# Patient Record
Sex: Female | Born: 1970
Health system: Southern US, Community
[De-identification: ages and names within clinical notes are randomized; demographics above are authoritative.]

## PROBLEM LIST (undated history)

## (undated) DIAGNOSIS — F329 Major depressive disorder, single episode, unspecified: Secondary | ICD-10-CM

## (undated) DIAGNOSIS — F419 Anxiety disorder, unspecified: Secondary | ICD-10-CM

## (undated) DIAGNOSIS — F32A Depression, unspecified: Secondary | ICD-10-CM

## (undated) HISTORY — DX: Depression, unspecified: F32.A

## (undated) HISTORY — DX: Anxiety disorder, unspecified: F41.9

## (undated) HISTORY — PX: INTRAUTERINE DEVICE INSERTION: SHX323

## (undated) HISTORY — PX: WISDOM TOOTH EXTRACTION: SHX21

## (undated) HISTORY — DX: Major depressive disorder, single episode, unspecified: F32.9

---

## 2015-04-13 ENCOUNTER — Encounter (HOSPITAL_BASED_OUTPATIENT_CLINIC_OR_DEPARTMENT_OTHER): Payer: Self-pay | Admitting: *Deleted

## 2015-04-13 ENCOUNTER — Emergency Department (HOSPITAL_BASED_OUTPATIENT_CLINIC_OR_DEPARTMENT_OTHER)
Admission: EM | Admit: 2015-04-13 | Discharge: 2015-04-13 | Disposition: A | Discharge status: Home / Self Care | Payer: Medicaid Other | Attending: Physician Assistant | Admitting: Physician Assistant

## 2015-04-13 DIAGNOSIS — Z87891 Personal history of nicotine dependence: Secondary | ICD-10-CM | POA: Diagnosis not present

## 2015-04-13 DIAGNOSIS — J029 Acute pharyngitis, unspecified: Secondary | ICD-10-CM | POA: Insufficient documentation

## 2015-04-13 LAB — RAPID STREP SCREEN (MED CTR MEBANE ONLY): STREPTOCOCCUS, GROUP A SCREEN (DIRECT): NEGATIVE

## 2015-04-13 LAB — URINALYSIS, ROUTINE W REFLEX MICROSCOPIC
Bilirubin Urine: NEGATIVE
GLUCOSE, UA: NEGATIVE mg/dL
Hgb urine dipstick: NEGATIVE
Ketones, ur: NEGATIVE mg/dL
Leukocytes, UA: NEGATIVE
Nitrite: NEGATIVE
Protein, ur: NEGATIVE mg/dL
SPECIFIC GRAVITY, URINE: 1.004 — AB (ref 1.005–1.030)
Urobilinogen, UA: 0.2 mg/dL (ref 0.0–1.0)
pH: 6.5 (ref 5.0–8.0)

## 2015-04-13 MED ORDER — ACETAMINOPHEN-CODEINE #3 300-30 MG PO TABS: 1.0000 | ORAL_TABLET | Freq: Three times a day (TID) | ORAL | Status: DC | PRN

## 2015-04-13 MED ORDER — AMOXICILLIN 500 MG PO CAPS: 500.0000 mg | ORAL_CAPSULE | Freq: Three times a day (TID) | ORAL | Status: DC

## 2015-04-13 NOTE — ED Notes (Signed)
Pt presents with sore throat this am, states has hx of having strep throat

## 2015-04-13 NOTE — ED Notes (Signed)
MD at bedside. 

## 2015-04-13 NOTE — ED Provider Notes (Signed)
CSN: [id number]     Arrival date & time 04/13/15  1751 History   First MD Initiated Contact with Patient 04/13/15 [phone]     Chief Complaint  Patient presents with  . Sore Throat     (Consider location/radiation/quality/duration/timing/severity/associated sxs/prior Treatment) HPI Patient is a 44 year old female presenting with recurrent sore throat. Patient's had this multiple times the last couple months. She's had 2 course of antibiotics for this. Patient says she gets strep throat a lot recently. Patient has no nausea no fever. Eating drinking normally. The sore throat started this morning.  Patient has had multiple new sexual partners.   History reviewed. No pertinent past medical history. History reviewed. No pertinent past surgical history. History reviewed. No pertinent family history. Social History  Substance Use Topics  . Smoking status: Former Research scientist (life sciences)  . Smokeless tobacco: None  . Alcohol Use: None     Comment: sociably   OB History    No data available     Review of Systems  Constitutional: Negative for activity change.  HENT: Positive for sore throat. Negative for drooling, hearing loss and sinus pressure.   Respiratory: Negative for shortness of breath.   Cardiovascular: Negative for chest pain.  Gastrointestinal: Negative for abdominal pain.      Allergies  Tetracyclines & related  Home Medications   Prior to Admission medications   Not on File   BP 130/87 mmHg  Pulse 73  Temp(Src) 98.5 F (36.9 C) (Oral)  Resp 16  Ht 5\' 5"  (1.651 m)  Wt 145 lb (65.772 kg)  BMI 24.13 kg/m2  SpO2 100%  LMP  Physical Exam  Constitutional: She is oriented to person, place, and time. She appears well-developed and well-nourished.  HENT:  Head: Normocephalic and atraumatic.  Mouth/Throat: Oropharyngeal exudate present.  Patient has mild erythema in the posterior pharynx. She has white exudate on bilateral tonsils. Mild lymphadenopathy.  Eyes: Right eye exhibits  no discharge.  Cardiovascular: Normal rate, regular rhythm and normal heart sounds.   No murmur heard. Pulmonary/Chest: Effort normal and breath sounds normal. She has no wheezes. She has no rales.  Abdominal: Soft. She exhibits no distension. There is no tenderness.  Neurological: She is oriented to person, place, and time.  Skin: Skin is warm and dry. She is not diaphoretic.  Psychiatric: She has a normal mood and affect.  Nursing note and vitals reviewed.   ED Course  Procedures (including critical care time) Labs Review Labs Reviewed  RAPID STREP SCREEN (NOT AT [facility])    Imaging Review No results found. I, [place], personally reviewed and evaluated these images and lab results as part of my medical decision-making.   EKG Interpretation None      MDM   Final diagnoses:  None   patient is a 44 year old female presented today with sore throat. Patient's had sore throat multiple times the last month. While this likely represents strep throat versus viral infection, given with the new sexual partners and recurrent sore throat, concern for chlamydia or gonorrhea. We'll check the lab to this test for this.  We'll send off lab for STDs.. But treat with amoxicillin in the meantime.  Patient's vitals normal physical exam otherwise normal.  Avarie Tavano Julio Alm, MD 04/13/15 [phone]

## 2015-04-13 NOTE — ED Notes (Signed)
States awoke with sore throat

## 2015-04-15 LAB — GC/CHLAMYDIA PROBE AMP (~~LOC~~) NOT AT ARMC
Chlamydia: NEGATIVE
NEISSERIA GONORRHEA: NEGATIVE

## 2015-04-15 LAB — URINE CULTURE: Culture: >=100000

## 2015-04-17 LAB — CULTURE, GROUP A STREP

## 2015-09-23 ENCOUNTER — Emergency Department (HOSPITAL_BASED_OUTPATIENT_CLINIC_OR_DEPARTMENT_OTHER)
Admission: EM | Admit: 2015-09-23 | Discharge: 2015-09-23 | Disposition: A | Discharge status: Home / Self Care | Payer: Medicaid Other | Attending: Emergency Medicine | Admitting: Emergency Medicine

## 2015-09-23 ENCOUNTER — Encounter (HOSPITAL_BASED_OUTPATIENT_CLINIC_OR_DEPARTMENT_OTHER): Payer: Self-pay | Admitting: Emergency Medicine

## 2015-09-23 DIAGNOSIS — Z87891 Personal history of nicotine dependence: Secondary | ICD-10-CM | POA: Insufficient documentation

## 2015-09-23 DIAGNOSIS — Z792 Long term (current) use of antibiotics: Secondary | ICD-10-CM | POA: Diagnosis not present

## 2015-09-23 DIAGNOSIS — M546 Pain in thoracic spine: Secondary | ICD-10-CM | POA: Insufficient documentation

## 2015-09-23 MED ORDER — METHOCARBAMOL 500 MG PO TABS: 500.0000 mg | ORAL_TABLET | Freq: Two times a day (BID) | ORAL | Status: DC

## 2015-09-23 MED ORDER — OXYCODONE-ACETAMINOPHEN 5-325 MG PO TABS
1.0000 | ORAL_TABLET | Freq: Once | ORAL | Status: AC
  Administered 2015-09-23: 1 via ORAL
  Filled 2015-09-23: qty 1

## 2015-09-23 MED ORDER — DIAZEPAM 5 MG PO TABS
5.0000 mg | ORAL_TABLET | Freq: Once | ORAL | Status: AC
  Administered 2015-09-23: 5 mg via ORAL
  Filled 2015-09-23: qty 1

## 2015-09-23 MED ORDER — HYDROCODONE-ACETAMINOPHEN 5-325 MG PO TABS: 1.0000 | ORAL_TABLET | ORAL | Status: DC | PRN

## 2015-09-23 MED ORDER — IBUPROFEN 800 MG PO TABS: 800.0000 mg | ORAL_TABLET | Freq: Three times a day (TID) | ORAL | Status: DC

## 2015-09-23 MED ORDER — KETOROLAC TROMETHAMINE 60 MG/2ML IM SOLN
60.0000 mg | Freq: Once | INTRAMUSCULAR | Status: AC
  Administered 2015-09-23: 60 mg via INTRAMUSCULAR
  Filled 2015-09-23: qty 2

## 2015-09-23 MED FILL — IBUPROFEN 800 MG TABLET: 800 | 7 days supply | Qty: 21 | Fill #0

## 2015-09-23 MED FILL — METHOCARBAMOL 500 MG TABLET: 500 | 10 days supply | Qty: 20 | Fill #0

## 2015-09-23 MED FILL — HYDROCODON-APAP 5-325: 5-325 | 2 days supply | Qty: 6 | Fill #0

## 2015-09-23 NOTE — Discharge Instructions (Signed)
Please take your medications as prescribed and as we discussed. Follow up with your doctor as needed for reevaluation. Return to ED for any new or worsening symptoms as we discussed.  Back Pain, Adult Back pain is very common in adults.The cause of back pain is rarely dangerous and the pain often gets better over time.The cause of your back pain may not be known. Some common causes of back pain include:  Strain of the muscles or ligaments supporting the spine.  Wear and tear (degeneration) of the spinal disks.  Arthritis.  Direct injury to the back. For many people, back pain may return. Since back pain is rarely dangerous, most people can learn to manage this condition on their own. HOME CARE INSTRUCTIONS Watch your back pain for any changes. The following actions may help to lessen any discomfort you are feeling:  Remain active. It is stressful on your back to sit or stand in one place for long periods of time. Do not sit, drive, or stand in one place for more than 30 minutes at a time. Take short walks on even surfaces as soon as you are able.Try to increase the length of time you walk each day.  Exercise regularly as directed by your health care provider. Exercise helps your back heal faster. It also helps avoid future injury by keeping your muscles strong and flexible.  Do not stay in bed.Resting more than 1-2 days can delay your recovery.  Pay attention to your body when you bend and lift. The most comfortable positions are those that put less stress on your recovering back. Always use proper lifting techniques, including:  Bending your knees.  Keeping the load close to your body.  Avoiding twisting.  Find a comfortable position to sleep. Use a firm mattress and lie on your side with your knees slightly bent. If you lie on your back, put a pillow under your knees.  Avoid feeling anxious or stressed.Stress increases muscle tension and can worsen back pain.It is important to  recognize when you are anxious or stressed and learn ways to manage it, such as with exercise.  Take medicines only as directed by your health care provider. Over-the-counter medicines to reduce pain and inflammation are often the most helpful.Your health care provider may prescribe muscle relaxant drugs.These medicines help dull your pain so you can more quickly return to your normal activities and healthy exercise.  Apply ice to the injured area:  Put ice in a plastic bag.  Place a towel between your skin and the bag.  Leave the ice on for 20 minutes, 2-3 times a day for the first 2-3 days. After that, ice and heat may be alternated to reduce pain and spasms.  Maintain a healthy weight. Excess weight puts extra stress on your back and makes it difficult to maintain good posture. SEEK MEDICAL CARE IF:  You have pain that is not relieved with rest or medicine.  You have increasing pain going down into the legs or buttocks.  You have pain that does not improve in one week.  You have night pain.  You lose weight.  You have a fever or chills. SEEK IMMEDIATE MEDICAL CARE IF:   You develop new bowel or bladder control problems.  You have unusual weakness or numbness in your arms or legs.  You develop nausea or vomiting.  You develop abdominal pain.  You feel faint.   This information is not intended to replace advice given to you by your health  care provider. Make sure you discuss any questions you have with your health care provider.   Document Released: 08/17/2005 Document Revised: 09/07/2014 Document Reviewed: 12/19/2013 Elsevier Interactive Patient Education [employer].

## 2015-09-23 NOTE — ED Provider Notes (Signed)
CSN: [id number]     Arrival date & time 09/23/15  0915 History   First MD Initiated Contact with Patient 09/23/15 [phone]     Chief Complaint  Patient presents with  . Back Pain     (Consider location/radiation/quality/duration/timing/severity/associated sxs/prior Treatment) HPI Cathy Schroeder is a 45 y.o. female who comes in for evaluation of back pain. 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 She reports the pain was worse this morning. Not relieved with Tylenol. Has been ambulatory. She denies any fevers, chills, numbness or weakness, loss of bowel or bladder function, IV drug use. Pain is worse with movement. Rated as severe. No other modifying factors.  History reviewed. No pertinent past medical history. History reviewed. No pertinent past surgical history. History reviewed. No pertinent family history. Social History  Substance Use Topics  . Smoking status: Former Research scientist (life sciences)  . Smokeless tobacco: None  . Alcohol Use: None     Comment: sociably   OB History    No data available     Review of Systems A 10 point review of systems was completed and was negative except for pertinent positives and negatives as mentioned in the history of present illness     Allergies  Tetracyclines & related  Home Medications   Prior to Admission medications   Medication Sig Start Date End Date Taking? Authorizing Provider  acetaminophen-codeine (TYLENOL #3) 300-30 MG per tablet Take 1-2 tablets by mouth every 8 (eight) hours as needed for moderate pain. 04/13/15   Courteney Lyn Mackuen, MD  amoxicillin (AMOXIL) 500 MG capsule Take 1 capsule (500 mg total) by mouth 3 (three) times daily. 04/13/15   Courteney Lyn Mackuen, MD  HYDROcodone-acetaminophen (NORCO/VICODIN) 5-325 MG tablet Take 1-2 tablets by mouth every 4 (four) hours as needed. 09/23/15   Comer Locket, PA-C  ibuprofen (ADVIL,MOTRIN) 800 MG  tablet Take 1 tablet (800 mg total) by mouth 3 (three) times daily. 09/23/15   Comer Locket, PA-C  methocarbamol (ROBAXIN) 500 MG tablet Take 1 tablet (500 mg total) by mouth 2 (two) times daily. 09/23/15   Comer Locket, PA-C   BP 123/73 mmHg  Pulse 68  Temp(Src) 98.6 F (37 C) (Oral)  Resp 20  Ht 5\' 5"  (1.651 m)  Wt 74.844 kg  BMI 27.46 kg/m2  SpO2 100% Physical Exam  Constitutional:  Awake, alert, nontoxic appearance with baseline speech.  HENT:  Head: Atraumatic.  Eyes: Pupils are equal, round, and reactive to light. Right eye exhibits no discharge. Left eye exhibits no discharge.  Neck: Neck supple.  Cardiovascular: Normal rate and regular rhythm.   No murmur heard. Pulmonary/Chest: Effort normal and breath sounds normal. No respiratory distress. She has no wheezes. She has no rales. She exhibits no tenderness.  Abdominal: Soft. Bowel sounds are normal. She exhibits no mass. There is no tenderness. There is no rebound.  Musculoskeletal:       Thoracic back: She exhibits no tenderness.       Lumbar back: She exhibits no tenderness.  Tenderness diffusely throughout right paraspinal thoracic musculature. No midline bony tenderness. No rash  Neurological:  Mental status baseline for patient.  Upper extremity motor strength and sensation intact and symmetric bilaterally. Motor strength appears to be intact for bilateral lower extremities, slightly reduced secondary to pain. Gait is somewhat antalgic but no ataxia. Completes fine motor coordination movements without difficulty.  Skin: No rash noted.  Psychiatric:  She has a normal mood and affect.  Nursing note and vitals reviewed.   ED Course  Procedures (including critical care time) Labs Review Labs Reviewed - No data to display  Imaging Review No results found. I have personally reviewed and evaluated these images and lab results as part of my medical decision-making.   EKG Interpretation None     Meds given in  ED:  Medications  oxyCODONE-acetaminophen (PERCOCET/ROXICET) 5-325 MG per tablet 1 tablet (1 tablet Oral Given 09/23/15 1013)  ketorolac (TORADOL) injection 60 mg (60 mg Intramuscular Given 09/23/15 1114)  diazepam (VALIUM) tablet 5 mg (5 mg Oral Given 09/23/15 1113)    New Prescriptions   HYDROCODONE-ACETAMINOPHEN (NORCO/VICODIN) 5-325 MG TABLET    Take 1-2 tablets by mouth every 4 (four) hours as needed.   IBUPROFEN (ADVIL,MOTRIN) 800 MG TABLET    Take 1 tablet (800 mg total) by mouth 3 (three) times daily.   METHOCARBAMOL (ROBAXIN) 500 MG TABLET    Take 1 tablet (500 mg total) by mouth 2 (two) times daily.   Filed Vitals:   09/23/15 0928 09/23/15 1153  BP: 150/96 123/73  Pulse: 86 68  Temp: 98.6 F (37 C)   TempSrc: Oral   Resp: 20 20  Height: 5\' 5"  (1.651 m)   Weight: 74.844 kg   SpO2: 100% 100%     MDM  Patient with back pain after mechanical injury.  No neurological deficits and normal neuro exam.  Patient can walk but states is painful.  No loss of bowel or bladder control.  No concern for cauda equina.  No fever, night sweats, weight loss, h/o cancer, IVDU.  RICE protocol and pain medicine indicated and discussed with patient. Pain managed in emergency department. Discharged with short course pain medicines and muscle relaxers-medication precautions discussed and patient verbalizes understanding. Patient overall appears well, nontoxic, hemodynamically stable and appropriate for discharge.  Final diagnoses:  Acute thoracic back pain       Comer Locket, PA-C 09/23/15 [place], DO 09/23/15 [phone]

## 2015-09-23 NOTE — ED Notes (Signed)
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

## 2015-11-12 ENCOUNTER — Encounter: Payer: Self-pay | Admitting: Obstetrics and Gynecology

## 2015-11-12 ENCOUNTER — Ambulatory Visit (INDEPENDENT_AMBULATORY_CARE_PROVIDER_SITE_OTHER): Payer: BLUE CROSS/BLUE SHIELD | Admitting: Obstetrics and Gynecology

## 2015-11-12 VITALS — BP 122/70 | HR 88 | Resp 16 | Ht 65.5 in | Wt 179.0 lb

## 2015-11-12 DIAGNOSIS — Z113 Encounter for screening for infections with a predominantly sexual mode of transmission: Secondary | ICD-10-CM

## 2015-11-12 DIAGNOSIS — Z01419 Encounter for gynecological examination (general) (routine) without abnormal findings: Secondary | ICD-10-CM

## 2015-11-12 DIAGNOSIS — Z124 Encounter for screening for malignant neoplasm of cervix: Secondary | ICD-10-CM | POA: Diagnosis not present

## 2015-11-12 DIAGNOSIS — F418 Other specified anxiety disorders: Secondary | ICD-10-CM

## 2015-11-12 DIAGNOSIS — Z23 Encounter for immunization: Secondary | ICD-10-CM

## 2015-11-12 DIAGNOSIS — Z30431 Encounter for routine checking of intrauterine contraceptive device: Secondary | ICD-10-CM | POA: Diagnosis not present

## 2015-11-12 MED ORDER — CITALOPRAM HYDROBROMIDE 20 MG PO TABS: ORAL_TABLET | ORAL | Status: DC

## 2015-11-12 NOTE — Patient Instructions (Signed)

## 2015-11-12 NOTE — Progress Notes (Signed)
Patient ID: Cathy Schroeder, female   DOB: 08/31/1971, 46 y.o.   MRN: [id number] 45 y.o. [id number] DivorcedCaucasianF here for annual exam. She has a mirena IUD, due to come out 6 months ago. She would like a new one.  Over the last year she has felt mild depressed, some days worse than others. No thoughts of hurting self or others. The depression is very out of character for her. Mom with h/o depression. She is under a lot of stress.  Sexually active, same partner x 7 months. Not using condoms.  No bleeding with the IUD. No vasomotor symptoms.     No LMP recorded. Patient is not currently having periods (Reason: IUD).          Sexually active: Yes.    The current method of family planning is IUD (expired).    Exercising: Yes.    walking and running  Smoker:  Former smoker   Health Maintenance: Pap:  5 years WNL per patient  History of abnormal Pap:  no MMG:  Never Colonoscopy:  Never BMD:   Never TDaP:  unsure Gardasil: N/A   reports that she has quit smoking. She has never used smokeless tobacco. She reports that she drinks alcohol. She reports that she does not use illicit drugs.Infrequent ETOH use. Going to school for nursing. 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 Kids are 16 ad 11. Both girls. Recently moved here form [place], 1.5 years ago.   Past Medical History  Diagnosis Date  . Anxiety   . Depression     Past Surgical History  Procedure Laterality Date  . Wisdom tooth extraction      No current outpatient prescriptions on file.   No current facility-administered medications for this visit.    Family History  Problem Relation Age of Onset  . Diabetes Mother   . Kidney cancer Mother   . Congestive Heart Failure Mother   Mom died at 59  Review of Systems  Constitutional: Negative.   HENT: Negative.   Eyes: Negative.   Respiratory: Negative.   Cardiovascular: Negative.   Gastrointestinal: Negative.   Endocrine: Negative.   Genitourinary: Negative.    Musculoskeletal: Negative.   Skin: Negative.   Allergic/Immunologic: Negative.   Neurological: Negative.   Psychiatric/Behavioral: Negative.   Occasional GSI, she does reports frequent voiding, normal amounts. She drinks caffeine.   Exam:   BP 122/70 mmHg  Pulse 88  Resp 16  Ht 5' 5.5" (1.664 m)  Wt 179 lb (81.194 kg)  BMI 29.32 kg/m2  Weight change: @WEIGHTCHANGE @ Height:   Height: 5' 5.5" (166.4 cm)  Ht Readings from Last 3 Encounters:  11/12/15 5' 5.5" (1.664 m)  09/23/15 5\' 5"  (1.651 m)  04/13/15 5\' 5"  (1.651 m)    General appearance: alert, cooperative and appears stated age Head: Normocephalic, without obvious abnormality, atraumatic Neck: no adenopathy, supple, symmetrical, trachea midline and thyroid normal to inspection and palpation Lungs: clear to auscultation bilaterally Breasts: normal appearance, no masses or tenderness Heart: regular rate and rhythm Abdomen: soft, non-tender; bowel sounds normal; no masses,  no organomegaly Extremities: extremities normal, atraumatic, no cyanosis or edema Skin: Skin color, texture, turgor normal. No rashes or lesions Lymph nodes: Cervical, supraclavicular, and axillary nodes normal. No abnormal inguinal nodes palpated Neurologic: Grossly normal   Pelvic: External genitalia:  no lesions              Urethra:  normal appearing urethra with no masses, tenderness or lesions  Bartholins and Skenes: normal                 Vagina: normal appearing vagina with normal color and discharge, no lesions              Cervix: no lesions               Bimanual Exam:  Uterus:  normal size, contour, position, consistency, mobility, non-tender and anteverted              Adnexa: no mass, fullness, tenderness               Rectovaginal: Confirms               Anus:  normal sphincter tone, no lesions  Chaperone was present for exam.  A:  Well Woman with normal exam  Depression/anxiety  Contraception  P:   Pap with  hpv  Mammogram  STD testing  CBC, CMP, Lipids, vit D, STD panel (will do all her blood work when she returns for her IUD)  Discussed breast self exam  Discussed calcium and vit D intake  Return for IUD removal and placement of a new IUD  Start Celexa, f/u 1 month   Addendum: will do the iud removal/reinsertion, celexa check and blood work all at her next visit

## 2015-11-12 NOTE — Addendum Note (Signed)
Addended by: Susanne Greenhouse E on: 11/12/2015 03:41 PM   Modules accepted: Orders

## 2015-11-13 ENCOUNTER — Telehealth: Payer: Self-pay | Admitting: Obstetrics and Gynecology

## 2015-11-13 NOTE — Telephone Encounter (Signed)
Called patient to review benefits for a recommended procedure. Unable to leave a message, states "mailbox is full and cannot accept any messages at this time."

## 2015-11-13 NOTE — Telephone Encounter (Signed)
Patient returned call

## 2015-11-14 LAB — IPS N GONORRHOEA AND CHLAMYDIA BY PCR

## 2015-11-14 LAB — IPS PAP TEST WITH HPV

## 2015-12-05 ENCOUNTER — Encounter: Payer: Self-pay | Admitting: Obstetrics and Gynecology

## 2015-12-05 ENCOUNTER — Other Ambulatory Visit: Payer: Self-pay | Admitting: Obstetrics and Gynecology

## 2015-12-05 ENCOUNTER — Ambulatory Visit (INDEPENDENT_AMBULATORY_CARE_PROVIDER_SITE_OTHER): Payer: BLUE CROSS/BLUE SHIELD | Admitting: Obstetrics and Gynecology

## 2015-12-05 VITALS — BP 122/80 | HR 80 | Resp 14 | Wt 177.0 lb

## 2015-12-05 DIAGNOSIS — Z0000 Encounter for general adult medical examination without abnormal findings: Secondary | ICD-10-CM

## 2015-12-05 DIAGNOSIS — Z30433 Encounter for removal and reinsertion of intrauterine contraceptive device: Secondary | ICD-10-CM | POA: Diagnosis not present

## 2015-12-05 DIAGNOSIS — Z01812 Encounter for preprocedural laboratory examination: Secondary | ICD-10-CM | POA: Diagnosis not present

## 2015-12-05 DIAGNOSIS — Z113 Encounter for screening for infections with a predominantly sexual mode of transmission: Secondary | ICD-10-CM | POA: Diagnosis not present

## 2015-12-05 DIAGNOSIS — F329 Major depressive disorder, single episode, unspecified: Secondary | ICD-10-CM | POA: Diagnosis not present

## 2015-12-05 DIAGNOSIS — F32A Depression, unspecified: Secondary | ICD-10-CM

## 2015-12-05 LAB — COMPREHENSIVE METABOLIC PANEL
ALBUMIN: 4.3 g/dL (ref 3.6–5.1)
ALK PHOS: 56 U/L (ref 33–115)
ALT: 16 U/L (ref 6–29)
AST: 15 U/L (ref 10–30)
BUN: 9 mg/dL (ref 7–25)
CALCIUM: 9.3 mg/dL (ref 8.6–10.2)
CHLORIDE: 103 mmol/L (ref 98–110)
CO2: 24 mmol/L (ref 20–31)
Creat: 0.7 mg/dL (ref 0.50–1.10)
Glucose, Bld: 89 mg/dL (ref 65–99)
POTASSIUM: 4.7 mmol/L (ref 3.5–5.3)
Sodium: 138 mmol/L (ref 135–146)
TOTAL PROTEIN: 7.8 g/dL (ref 6.1–8.1)
Total Bilirubin: 1.4 mg/dL — ABNORMAL HIGH (ref 0.2–1.2)

## 2015-12-05 LAB — CBC
HEMATOCRIT: 42 % (ref 35.0–45.0)
HEMOGLOBIN: 14.2 g/dL (ref 11.7–15.5)
MCH: 30.2 pg (ref 27.0–33.0)
MCHC: 33.8 g/dL (ref 32.0–36.0)
MCV: 89.4 fL (ref 80.0–100.0)
MPV: 11.6 fL (ref 7.5–12.5)
Platelets: 223 10*3/uL (ref 140–400)
RBC: 4.7 MIL/uL (ref 3.80–5.10)
RDW: 13 % (ref 11.0–15.0)
WBC: 9.3 10*3/uL (ref 3.8–10.8)

## 2015-12-05 LAB — LIPID PANEL
CHOL/HDL RATIO: 3.6 ratio (ref <=5.0)
CHOLESTEROL: 155 mg/dL (ref 125–200)
HDL: 43 mg/dL — ABNORMAL LOW (ref >=46)
LDL Cholesterol: 93 mg/dL (ref <130)
TRIGLYCERIDES: 97 mg/dL (ref <150)
VLDL: 19 mg/dL (ref <30)

## 2015-12-05 LAB — POCT URINE PREGNANCY: PREG TEST UR: NEGATIVE

## 2015-12-05 LAB — HEPATITIS C ANTIBODY: HCV AB: NEGATIVE

## 2015-12-05 MED ORDER — BUPROPION HCL ER (XL) 150 MG PO TB24: 150.0000 mg | ORAL_TABLET | Freq: Every day | ORAL | Status: DC

## 2015-12-05 MED ORDER — CITALOPRAM HYDROBROMIDE 10 MG PO TABS: 10.0000 mg | ORAL_TABLET | Freq: Every day | ORAL | Status: DC

## 2015-12-05 NOTE — Progress Notes (Signed)
Patient ID: Cathy Schroeder, female   DOB: 19-Jul-1971, 45 y.o.   MRN: [id number] GYNECOLOGY  VISIT   HPI: 45 y.o.   Divorced  Caucasian  female   [phone] with No LMP recorded. Patient is not currently having periods (Reason: IUD).   here for  IUD removal and reinsertion (Mirena). She is also follow up on medication. She was started on Celexa 3 weeks ago for depression with anxiety. It makes her slightly nauseas. She feels it is helping her mood some, less labile, a little more "up". Runs out of stream at the end of the day. Sleeping okay. Libido has decreased with is bothersome.   GYNECOLOGIC HISTORY: No LMP recorded. Patient is not currently having periods (Reason: IUD). Contraception:IUD (Expired) Menopausal hormone therapy: None        OB History    Gravida Para Term Preterm AB TAB SAB Ectopic Multiple Living   3 2 2  1     2          There are no active problems to display for this patient.   Past Medical History  Diagnosis Date  . Anxiety   . Depression     Past Surgical History  Procedure Laterality Date  . Wisdom tooth extraction      Current Outpatient Prescriptions  Medication Sig Dispense Refill  . buPROPion (WELLBUTRIN XL) 150 MG 24 hr tablet Take 1 tablet (150 mg total) by mouth daily. 30 tablet 1  . citalopram (CELEXA) 10 MG tablet Take 1 tablet (10 mg total) by mouth daily. 30 tablet 1   No current facility-administered medications for this visit.     ALLERGIES: Tetracyclines & related  Family History  Problem Relation Age of Onset  . Diabetes Mother   . Kidney cancer Mother   . Congestive Heart Failure Mother     Social History   Social History  . Marital Status: Divorced    Spouse Name: N/A  . Number of Children: N/A  . Years of Education: N/A   Occupational History  . Not on file.   Social History Main Topics  . Smoking status: Former Research scientist (life sciences)  . Smokeless tobacco: Never Used  . Alcohol Use: 0.0 oz/week    0 Standard drinks or equivalent  per week     Comment: sociably  . Drug Use: No  . Sexual Activity:    Partners: Male   Other Topics Concern  . Not on file   Social History Narrative    Review of Systems  Constitutional: Negative.   HENT: Negative.   Eyes: Negative.   Respiratory: Negative.   Cardiovascular: Negative.   Gastrointestinal: Negative.   Genitourinary: Negative.   Musculoskeletal: Negative.   Skin: Negative.   Neurological: Negative.   Endo/Heme/Allergies: Negative.   Psychiatric/Behavioral: Negative.     PHYSICAL EXAMINATION:    BP 122/80 mmHg  Pulse 80  Resp 14  Wt 177 lb (80.287 kg)    General appearance: alert, cooperative and appears stated age  Pelvic: External genitalia:  no lesions              Urethra:  normal appearing urethra with no masses, tenderness or lesions              Bartholins and Skenes: normal                 Vagina: normal appearing vagina with normal color and discharge, no lesions  Cervix: No lesions, IUD string 3 cm. IUD removed with ringed forceps   The risks of the mirena IUD were reviewed with the patient, including infection, abnormal bleeding and uterine perfortion. Consent was signed.  A speculum was placed in the vagina, the cervix was cleansed with betadine. A tenaculum was placed on the cervix, the uterus sounded to 8 cm. The cervix was dilated to a 5 hagar dilator  The mirena IUD was inserted without difficulty. The string were cut to 3-4 cm. The tenaculum was removed. Slight oozing from the tenaculum site was stopped with pressure.   The patient tolerated the procedure well.   Chaperone was present for exam.  ASSESSMENT Depression/Anxiety IUD removal and reinsertion Baseline labs (fasting) and rest of STD testing today    PLAN Will decrease her Celexa to 10 mg a day and add Wellbutrin 150 mg a day, F/u in 1 month Screening labs and blood work for STD testing F/U for an IUD check in 1 month    An After Visit Summary was  printed and given to the patient.  In addition to the IUD minutes face to face time of which over 50% was spent in counseling.

## 2015-12-06 LAB — VITAMIN D 25 HYDROXY (VIT D DEFICIENCY, FRACTURES): Vit D, 25-Hydroxy: 20 ng/mL — ABNORMAL LOW (ref 30–100)

## 2015-12-06 LAB — STD PANEL
HIV: NONREACTIVE
Hepatitis B Surface Ag: NEGATIVE

## 2015-12-09 ENCOUNTER — Telehealth: Payer: Self-pay

## 2015-12-09 LAB — BILIRUBIN, FRACTIONATED(TOT/DIR/INDIR)
BILIRUBIN DIRECT: 0.3 mg/dL — AB (ref <=0.2)
BILIRUBIN INDIRECT: 1.1 mg/dL (ref 0.2–1.2)
BILIRUBIN TOTAL: 1.4 mg/dL — AB (ref 0.2–1.2)

## 2015-12-09 NOTE — Telephone Encounter (Signed)
Spoke with patient. Advised of results as seen below from [place]. She is agreeable and verbalizes understanding. Requesting to pick up a copy of her aex stating that she had her TDap. She will come by the office to pick this up today.  Routing to provider for final review. Patient agreeable to disposition. Will close encounter.

## 2015-12-09 NOTE — Telephone Encounter (Signed)
-----   Message from Salvadore Dom, MD sent at 12/09/2015 10:03 AM EDT ----- Please inform the patient that her direct bilirubin was slightly elevated, I'm trying to add additional labs.  Her Vit D level was low. Please have her start 1,000 IU a day of vit D3, should use daily long term Her HDL was slightly low, the rest of her lipids were normal. A low HDL can increase her risk for heart disease. She should eat a diet low in saturated fats and exercise at least 3 days a week for 30 minutes.  The rest of her blood work was normal

## 2015-12-12 ENCOUNTER — Telehealth: Payer: Self-pay

## 2015-12-12 DIAGNOSIS — R17 Unspecified jaundice: Secondary | ICD-10-CM

## 2015-12-12 NOTE — Telephone Encounter (Signed)
Left message to call [facility] [place] at [phone]. 

## 2015-12-12 NOTE — Telephone Encounter (Signed)
Spoke with patient. Advised of results as seen below from [place]. She denies any RUQ abdominal pain. Aware if she develops any RUQ pain she will need to be seen with her PCP. She is agreeable. Patient is scheduled for a 2 week lab recheck on 12/27/2015 at 4 pm. She is agreeable to date and time. Future orders for total, direct, and indirect bilirubin have been placed.  Routing to provider for final review. Patient agreeable to disposition. Will close encounter.

## 2015-12-12 NOTE — Telephone Encounter (Signed)
-----   Message from Salvadore Dom, MD sent at 12/09/2015  2:34 PM EDT ----- Please inform the patient that her total and direct bilirubin are just minimally elevated. She should have it repeated in 2 weeks, if still elevated, or if she is having RUQ abdominal pain, she will need to be evaluated by a primary MD. Please place the order for total, direct and indirect bilirubin for 2 weeks from now.

## 2015-12-24 ENCOUNTER — Other Ambulatory Visit: Payer: Self-pay | Admitting: Occupational Medicine

## 2015-12-24 ENCOUNTER — Ambulatory Visit (HOSPITAL_COMMUNITY)
Admission: RE | Admit: 2015-12-24 | Discharge: 2015-12-24 | Disposition: A | Discharge status: Home / Self Care | Payer: Medicaid Other | Source: Ambulatory Visit | Attending: Occupational Medicine | Admitting: Occupational Medicine

## 2015-12-24 DIAGNOSIS — A159 Respiratory tuberculosis unspecified: Secondary | ICD-10-CM

## 2015-12-24 DIAGNOSIS — A150 Tuberculosis of lung: Secondary | ICD-10-CM | POA: Insufficient documentation

## 2015-12-24 DIAGNOSIS — J8410 Pulmonary fibrosis, unspecified: Secondary | ICD-10-CM | POA: Diagnosis not present

## 2015-12-27 ENCOUNTER — Telehealth: Payer: Self-pay | Admitting: Obstetrics and Gynecology

## 2015-12-27 ENCOUNTER — Other Ambulatory Visit (INDEPENDENT_AMBULATORY_CARE_PROVIDER_SITE_OTHER): Payer: BLUE CROSS/BLUE SHIELD

## 2015-12-27 ENCOUNTER — Other Ambulatory Visit: Payer: Self-pay | Admitting: *Deleted

## 2015-12-27 DIAGNOSIS — R17 Unspecified jaundice: Secondary | ICD-10-CM

## 2015-12-27 LAB — BILIRUBIN, FRACTIONATED(TOT/DIR/INDIR)
BILIRUBIN DIRECT: 0.2 mg/dL (ref <=0.2)
BILIRUBIN INDIRECT: 0.6 mg/dL (ref 0.2–1.2)
BILIRUBIN TOTAL: 0.8 mg/dL (ref 0.2–1.2)

## 2015-12-27 NOTE — Telephone Encounter (Signed)
Patient came in for a lab appointment today. At checkout she asked if I could send a message to the nurse for assistance with her medications. She said, "I'd like to increase my Wellbutrin to the next level which is 300 mg I think. I am still having some problems with not feeling well in the afternoon so maybe I need it twice a day. I'd like to stop my Celexa. I was prescribed Celexa for anxiety but I'd like something else that I can just take as needed instead of daily. This is nothing urgent so it is fine if the nurse calls me next week."  Pharmacy on file is correct.

## 2015-12-30 NOTE — Telephone Encounter (Signed)
She is due for an IUD check, we can further discuss at that appointment. If she is still on the 10 mg a day of celexa, she can stop it. She would need to see her primary for a medication like ativan or xanax.

## 2015-12-30 NOTE — Telephone Encounter (Signed)
Spoke with patient. Advised of message as seen below from [place]. She is agreeable and verbalizes understanding. Reports she will need to return call to the office to schedule her appointment as she just started a new job.  Routing to provider for final review. Patient agreeable to disposition. Will close encounter.

## 2015-12-30 NOTE — Telephone Encounter (Signed)
Routing to Dr.Jertson for review and advise. 

## 2015-12-30 NOTE — Telephone Encounter (Signed)
Left message to call Kaitlyn at [phone]. 

## 2016-01-09 ENCOUNTER — Ambulatory Visit: Payer: BLUE CROSS/BLUE SHIELD | Admitting: Obstetrics and Gynecology

## 2016-01-20 ENCOUNTER — Telehealth: Payer: Self-pay | Admitting: Obstetrics and Gynecology

## 2016-01-20 MED ORDER — BUPROPION HCL ER (XL) 150 MG PO TB24: 150.0000 mg | ORAL_TABLET | Freq: Every day | ORAL | Status: DC

## 2016-01-20 NOTE — Telephone Encounter (Signed)
Please give her a one month supply

## 2016-01-20 NOTE — Telephone Encounter (Signed)
Order placed for 30 tablets to [facility] per Dr. Talbert Nan. Patient aware will call her if any concerns regarding refill.  Will close encounter.

## 2016-01-20 NOTE — Telephone Encounter (Signed)
Call to patient. She has left her prescription for Wellbutrin 150 XL out of town.  Last fill per [facility] was 12/29/15. Patient would like to refill and not miss any doses before follow up appointment with Dr. Talbert Nan on 01/29/16.  Advised patient will check to see if okay to refill with Dr. Talbert Nan and return calls if any concerns. Patient agreeable.

## 2016-01-20 NOTE — Telephone Encounter (Signed)
Patient went to the beach over the weekend and left her cosmetic bag with her Welbutrin prescription. Patient is not sure what to do now?

## 2016-01-23 ENCOUNTER — Emergency Department (HOSPITAL_BASED_OUTPATIENT_CLINIC_OR_DEPARTMENT_OTHER)
Admission: EM | Admit: 2016-01-23 | Discharge: 2016-01-23 | Disposition: A | Discharge status: Home / Self Care | Payer: Medicaid Other | Attending: Emergency Medicine | Admitting: Emergency Medicine

## 2016-01-23 ENCOUNTER — Encounter (HOSPITAL_BASED_OUTPATIENT_CLINIC_OR_DEPARTMENT_OTHER): Payer: Self-pay

## 2016-01-23 DIAGNOSIS — M545 Low back pain: Secondary | ICD-10-CM | POA: Diagnosis present

## 2016-01-23 DIAGNOSIS — Z87891 Personal history of nicotine dependence: Secondary | ICD-10-CM | POA: Insufficient documentation

## 2016-01-23 DIAGNOSIS — F329 Major depressive disorder, single episode, unspecified: Secondary | ICD-10-CM | POA: Insufficient documentation

## 2016-01-23 DIAGNOSIS — M5489 Other dorsalgia: Secondary | ICD-10-CM

## 2016-01-23 MED ORDER — KETOROLAC TROMETHAMINE 30 MG/ML IJ SOLN
30.0000 mg | Freq: Once | INTRAMUSCULAR | Status: DC
  Filled 2016-01-23: qty 1

## 2016-01-23 MED ORDER — KETOROLAC TROMETHAMINE 30 MG/ML IJ SOLN
30.0000 mg | Freq: Once | INTRAMUSCULAR | Status: AC
  Administered 2016-01-23: 30 mg via INTRAMUSCULAR

## 2016-01-23 MED ORDER — DIAZEPAM 5 MG PO TABS
5.0000 mg | ORAL_TABLET | Freq: Once | ORAL | Status: AC
  Administered 2016-01-23: 5 mg via ORAL
  Filled 2016-01-23: qty 1

## 2016-01-23 MED ORDER — MELOXICAM 7.5 MG PO TABS: 15.0000 mg | ORAL_TABLET | Freq: Every day | ORAL | Status: DC

## 2016-01-23 NOTE — ED Notes (Signed)
C/o lower back pain x this pm-recent hx of lower back pain while lifting a pt-slow steady gait

## 2016-01-23 NOTE — ED Provider Notes (Signed)
CSN: [id number]     Arrival date & time 01/23/16  2141 History   First MD Initiated Contact with Patient 01/23/16 2217     Chief Complaint  Patient presents with  . Back Pain     (Consider location/radiation/quality/duration/timing/severity/associated sxs/prior Treatment) HPI Cathy Schroeder is a 45 y.o. female here for evaluation of back pain that started today. Patient reports lower right-sided back pain that is throbbing and stabbing in nature that radiates into right buttock. She denies any injury or trauma. No numbness, weakness, urinary or bowel changes, fevers or chills, IV drug use. Has not taken anything to improve her symptoms. Certain movements worsen her discomfort. No abdominal pain, urinary symptoms, nausea or vomiting.  Past Medical History  Diagnosis Date  . Anxiety   . Depression    Past Surgical History  Procedure Laterality Date  . Wisdom tooth extraction     Family History  Problem Relation Age of Onset  . Diabetes Mother   . Kidney cancer Mother   . Congestive Heart Failure Mother    Social History  Substance Use Topics  . Smoking status: Former Research scientist (life sciences)  . Smokeless tobacco: Never Used  . Alcohol Use: 0.0 oz/week    0 Standard drinks or equivalent per week     Comment: social   OB History    Gravida Para Term Preterm AB TAB SAB Ectopic Multiple Living   3 2 2  1     2      Review of Systems A 10 point review of systems was completed and was negative except for pertinent positives and negatives as mentioned in the history of present illness     Allergies  Tetracyclines & related  Home Medications   Prior to Admission medications   Medication Sig Start Date End Date Taking? Authorizing Provider  buPROPion (WELLBUTRIN XL) 150 MG 24 hr tablet Take 1 tablet (150 mg total) by mouth daily. 01/20/16   Salvadore Dom, MD  meloxicam (MOBIC) 7.5 MG tablet Take 2 tablets (15 mg total) by mouth daily. 01/23/16   Ruhan Borak, PA-C   BP 150/94 mmHg   Pulse 88  Temp(Src) 98.6 F (37 C) (Oral)  Resp 20  Ht 5\' 5"  (1.651 m)  Wt 77.111 kg  BMI 28.29 kg/m2  SpO2 100% Physical Exam  Constitutional: She appears well-developed and well-nourished. No distress.  HENT:  Head: Normocephalic and atraumatic.  Eyes: Conjunctivae and EOM are normal. Right eye exhibits no discharge. Left eye exhibits no discharge. No scleral icterus.  Neck: Normal range of motion. Neck supple.  Pulmonary/Chest: Effort normal. No respiratory distress.  Abdominal: Soft. She exhibits no distension. There is no tenderness.  Musculoskeletal: Normal range of motion.  Diffuse tenderness in right lumbar paraspinal musculature. No focal midline bony or spinous process tenderness. No crepitus, tenting, rash or other skin abnormality. Full active range of motion of CTL spine. Full active range of motion of all extremities.  Neurological:  Motor strength and sensation appear baseline for patient. Able to stand on tiptoe, dorsiflex great toe. Gait baseline. Negative straight leg raise  Skin: She is not diaphoretic.    ED Course  Procedures (including critical care time) Labs Review Labs Reviewed - No data to display  Imaging Review No results found. I have personally reviewed and evaluated these images and lab results as part of my medical decision-making.   EKG Interpretation None     Meds given in ED:  Medications  diazepam (VALIUM) tablet 5 mg (5  mg Oral Given 01/23/16 2233)  ketorolac (TORADOL) 30 MG/ML injection 30 mg (30 mg Intramuscular Given 01/23/16 2238)    Discharge Medication List as of 01/23/2016 10:59 PM    START taking these medications   Details  meloxicam (MOBIC) 7.5 MG tablet Take 2 tablets (15 mg total) by mouth daily., Starting 01/23/2016, Until Discontinued, Print       Filed Vitals:   01/23/16 2146  BP: 150/94  Pulse: 88  Temp: 98.6 F (37 C)  TempSrc: Oral  Resp: 20  Height: 5\' 5"  (1.651 m)  Weight: 77.111 kg  SpO2: 100%     MDM  Patient with back pain.  No neurological deficits and normal neuro exam.  Patient can walk but states is painful.  No loss of bowel or bladder control.  No concern for cauda equina.  No fever, night sweats, weight loss, h/o cancer, IVDU.  RICE protocol and pain medicine indicated and discussed with patient.   Final diagnoses:  Right-sided back pain, unspecified location        Comer Locket, PA-C 01/23/16 2353  Fredia Sorrow, MD 01/25/16 1731

## 2016-01-23 NOTE — Discharge Instructions (Signed)
Take your medications as prescribed. Follow-up with your doctor as needed. Return to ED for new or worsening symptoms.  Back Pain, Adult Back pain is very common in adults.The cause of back pain is rarely dangerous and the pain often gets better over time.The cause of your back pain may not be known. Some common causes of back pain include:  Strain of the muscles or ligaments supporting the spine.  Wear and tear (degeneration) of the spinal disks.  Arthritis.  Direct injury to the back. For many people, back pain may return. Since back pain is rarely dangerous, most people can learn to manage this condition on their own. HOME CARE INSTRUCTIONS Watch your back pain for any changes. The following actions may help to lessen any discomfort you are feeling:  Remain active. It is stressful on your back to sit or stand in one place for long periods of time. Do not sit, drive, or stand in one place for more than 30 minutes at a time. Take short walks on even surfaces as soon as you are able.Try to increase the length of time you walk each day.  Exercise regularly as directed by your health care provider. Exercise helps your back heal faster. It also helps avoid future injury by keeping your muscles strong and flexible.  Do not stay in bed.Resting more than 1-2 days can delay your recovery.  Pay attention to your body when you bend and lift. The most comfortable positions are those that put less stress on your recovering back. Always use proper lifting techniques, including:  Bending your knees.  Keeping the load close to your body.  Avoiding twisting.  Find a comfortable position to sleep. Use a firm mattress and lie on your side with your knees slightly bent. If you lie on your back, put a pillow under your knees.  Avoid feeling anxious or stressed.Stress increases muscle tension and can worsen back pain.It is important to recognize when you are anxious or stressed and learn ways to  manage it, such as with exercise.  Take medicines only as directed by your health care provider. Over-the-counter medicines to reduce pain and inflammation are often the most helpful.Your health care provider may prescribe muscle relaxant drugs.These medicines help dull your pain so you can more quickly return to your normal activities and healthy exercise.  Apply ice to the injured area:  Put ice in a plastic bag.  Place a towel between your skin and the bag.  Leave the ice on for 20 minutes, 2-3 times a day for the first 2-3 days. After that, ice and heat may be alternated to reduce pain and spasms.  Maintain a healthy weight. Excess weight puts extra stress on your back and makes it difficult to maintain good posture. SEEK MEDICAL CARE IF:  You have pain that is not relieved with rest or medicine.  You have increasing pain going down into the legs or buttocks.  You have pain that does not improve in one week.  You have night pain.  You lose weight.  You have a fever or chills. SEEK IMMEDIATE MEDICAL CARE IF:   You develop new bowel or bladder control problems.  You have unusual weakness or numbness in your arms or legs.  You develop nausea or vomiting.  You develop abdominal pain.  You feel faint.   This information is not intended to replace advice given to you by your health care provider. Make sure you discuss any questions you have with your  health care provider.   Document Released: 08/17/2005 Document Revised: 09/07/2014 Document Reviewed: 12/19/2013 Elsevier Interactive Patient Education [employer].

## 2016-01-29 ENCOUNTER — Ambulatory Visit: Payer: BLUE CROSS/BLUE SHIELD | Admitting: Obstetrics and Gynecology

## 2016-02-03 ENCOUNTER — Ambulatory Visit: Payer: BLUE CROSS/BLUE SHIELD | Admitting: Obstetrics and Gynecology

## 2016-02-07 ENCOUNTER — Encounter: Payer: Self-pay | Admitting: Obstetrics and Gynecology

## 2016-02-07 ENCOUNTER — Ambulatory Visit (INDEPENDENT_AMBULATORY_CARE_PROVIDER_SITE_OTHER): Payer: BLUE CROSS/BLUE SHIELD | Admitting: Obstetrics and Gynecology

## 2016-02-07 ENCOUNTER — Telehealth: Payer: Self-pay | Admitting: Obstetrics and Gynecology

## 2016-02-07 VITALS — BP 112/80 | HR 84 | Resp 14 | Wt 178.0 lb

## 2016-02-07 DIAGNOSIS — R350 Frequency of micturition: Secondary | ICD-10-CM | POA: Diagnosis not present

## 2016-02-07 DIAGNOSIS — F418 Other specified anxiety disorders: Secondary | ICD-10-CM | POA: Diagnosis not present

## 2016-02-07 DIAGNOSIS — R3915 Urgency of urination: Secondary | ICD-10-CM | POA: Diagnosis not present

## 2016-02-07 DIAGNOSIS — B372 Candidiasis of skin and nail: Secondary | ICD-10-CM

## 2016-02-07 DIAGNOSIS — Z30431 Encounter for routine checking of intrauterine contraceptive device: Secondary | ICD-10-CM

## 2016-02-07 LAB — POCT URINALYSIS DIPSTICK
BILIRUBIN UA: NEGATIVE
Blood, UA: NEGATIVE
GLUCOSE UA: NEGATIVE
Ketones, UA: NEGATIVE
Nitrite, UA: NEGATIVE
Protein, UA: NEGATIVE
Urobilinogen, UA: NEGATIVE
pH, UA: 6.5

## 2016-02-07 MED ORDER — NYSTATIN 100000 UNIT/GM EX CREA: 1.0000 "application " | TOPICAL_CREAM | Freq: Two times a day (BID) | CUTANEOUS | Status: DC

## 2016-02-07 MED ORDER — PHENAZOPYRIDINE HCL 200 MG PO TABS: ORAL_TABLET | ORAL | Status: DC

## 2016-02-07 MED ORDER — SULFAMETHOXAZOLE-TRIMETHOPRIM 800-160 MG PO TABS: 1.0000 | ORAL_TABLET | Freq: Two times a day (BID) | ORAL | Status: DC

## 2016-02-07 MED ORDER — BUPROPION HCL ER (XL) 300 MG PO TB24: 300.0000 mg | ORAL_TABLET | Freq: Every day | ORAL | Status: DC

## 2016-02-07 NOTE — Telephone Encounter (Signed)
Patient called and said, "My pharmacy sent me a message that my generic Peridium is not covered by my insurance so I need to know what to do. I am headed to the pharmacy now."  Pharmacy on file is correct.

## 2016-02-07 NOTE — Telephone Encounter (Signed)
Rx for Pyridium 200 mg 1 tablet po tid x 48 hours #6 0RF sent to pharmacy on file by Dr.Jertson this morning for discomfort with urination due to possible UTI. This medication is not covered by the patients insurance.  Routing to [place] for review and advise.

## 2016-02-07 NOTE — Telephone Encounter (Signed)
OK for over the counter AZO standard 100 mg, take 2 tablets by mouth, three times a day for the next 2 days.  This is exactly the same medication!

## 2016-02-07 NOTE — Progress Notes (Signed)
Patient ID: Cathy Schroeder, female   DOB: 07/24/1971, 45 y.o.   MRN: [id number] GYNECOLOGY  VISIT   HPI: 45 y.o.   Divorced  Caucasian  female   [phone] with No LMP recorded. Patient is not currently having periods (Reason: IUD).   here for  Medication and IUD check. She stopped the Celexa 10 mg a day and Wellbutrin 150 mg was started last month. She is feeling much better, still feeling a little blue in the afternoons.  She c/o urinary frequency in the last month. Voiding small amounts, urgency to void. In the last few days she feels slightly uncomfortable when she voids, not burning.  She isn't having any issues with the new mirena IUD, no bleeding. On questioning she does c/o irritation in bilateral inguinal folds. She also c/o a rash on her left upper arm. The rash on her arm appears to be extending.  GYNECOLOGIC HISTORY: No LMP recorded. Patient is not currently having periods (Reason: IUD). Contraception:IUD ([place]) Menopausal hormone therapy: none         OB History    Gravida Para Term Preterm AB TAB SAB Ectopic Multiple Living   3 2 2  1     2          There are no active problems to display for this patient.   Past Medical History  Diagnosis Date  . Anxiety   . Depression     Past Surgical History  Procedure Laterality Date  . Wisdom tooth extraction    . Intrauterine device insertion      Current Outpatient Prescriptions  Medication Sig Dispense Refill  . levonorgestrel (MIRENA) 20 MCG/24HR IUD 1 each by Intrauterine route once.    . meloxicam (MOBIC) 7.5 MG tablet Take 2 tablets (15 mg total) by mouth daily. 30 tablet 0  . buPROPion (WELLBUTRIN XL) 300 MG 24 hr tablet Take 1 tablet (300 mg total) by mouth daily. 90 tablet 2  . nystatin cream (MYCOSTATIN) Apply 1 application topically 2 (two) times daily. Apply to affected area BID for up to 7 days. 30 g 0  . phenazopyridine (PYRIDIUM) 200 MG tablet 1 tab po tid x 48 hours 6 tablet 0  .  sulfamethoxazole-trimethoprim (BACTRIM DS) 800-160 MG tablet Take 1 tablet by mouth 2 (two) times daily. One PO BID x 3 days 6 tablet 0   No current facility-administered medications for this visit.     ALLERGIES: Tetracyclines & related  Family History  Problem Relation Age of Onset  . Diabetes Mother   . Kidney cancer Mother   . Congestive Heart Failure Mother     Social History   Social History  . Marital Status: Divorced    Spouse Name: N/A  . Number of Children: N/A  . Years of Education: N/A   Occupational History  . Not on file.   Social History Main Topics  . Smoking status: Former Research scientist (life sciences)  . Smokeless tobacco: Never Used  . Alcohol Use: 0.0 oz/week    0 Standard drinks or equivalent per week     Comment: social  . Drug Use: No  . Sexual Activity:    Partners: Male    Patent examiner Protection: IUD   Other Topics Concern  . Not on file   Social History Narrative    Review of Systems  Constitutional: Negative.   HENT: Negative.   Eyes: Negative.   Respiratory: Negative.   Cardiovascular: Negative.   Gastrointestinal: Negative.   Genitourinary: Positive for frequency.  Musculoskeletal: Positive for myalgias.  Skin: Negative.   Neurological: Negative.   Endo/Heme/Allergies: Negative.   Psychiatric/Behavioral: Positive for depression.    PHYSICAL EXAMINATION:    BP 112/80 mmHg  Pulse 84  Resp 14  Wt 178 lb (80.74 kg)    General appearance: alert, cooperative and appears stated age Skin: on her upper arm she has a mild erythematous rash with clear borders, it appears to be changing the pigmentation in her skin. She has a separate area of the rash that is in a circle.   Pelvic: External genitalia:  no lesions. She does have a patchy, erythematous rash in her bilateral inguinal folds, c/w candida intertrigo.               Urethra:  normal appearing urethra with no masses, tenderness or lesions              Bartholins and Skenes: normal                  Vagina: normal appearing vagina with normal color and discharge, no lesions              Cervix: no lesions and IUD strings 3-4 cm              Bimanual Exam:  Uterus:  normal size, contour, position, consistency, mobility, non-tender              Adnexa: no mass, fullness, tenderness               Chaperone was present for exam.  ASSESSMENT Depression, improved on the Wellbutrin 150 mg, still a little "blue", she would like to increase the dose Anxiety is stable, currently tolerable IUD check, doing well Urinary frequency and urgency, some discomfort, dip concerning for infection Candida intertrigo Rash on her arm    PLAN Increase Wellbutrin to 300 mg qd Will treat for presumed UTI with bactrim and pyridium Treat candida Recommended she f/u with her primary or dermatology for the rash on her arm F/U as needed or in 3/18 for her next annual exam   An After Visit Summary was printed and given to the patient.

## 2016-02-07 NOTE — Telephone Encounter (Signed)
Spoke with patient. Advised of message as seen below from [place]. She is agreeable and verbalizes understanding.  Routing to provider for final review. Patient agreeable to disposition. Will close encounter.

## 2016-02-08 LAB — URINALYSIS, MICROSCOPIC ONLY
CRYSTALS: NONE SEEN [HPF]
Casts: NONE SEEN [LPF]
Squamous Epithelial / LPF: NONE SEEN [HPF] (ref <=5)
Yeast: NONE SEEN [HPF]

## 2016-02-10 ENCOUNTER — Telehealth: Payer: Self-pay | Admitting: *Deleted

## 2016-02-10 LAB — URINE CULTURE: Colony Count: >=100000

## 2016-02-10 NOTE — Telephone Encounter (Signed)
Patient called back- gave results- see result note -eh

## 2016-02-10 NOTE — Telephone Encounter (Signed)
02-10-16 left message to call -eh

## 2016-02-10 NOTE — Telephone Encounter (Signed)
-----   Message from Salvadore Dom, MD sent at 02/10/2016  9:47 AM EDT ----- Please let the patient know that she does have a UTI, the antibiotic she is on should be appropriate for treating it. Please see if she is feeling better.

## 2016-03-12 ENCOUNTER — Ambulatory Visit (INDEPENDENT_AMBULATORY_CARE_PROVIDER_SITE_OTHER): Payer: BLUE CROSS/BLUE SHIELD | Admitting: Obstetrics and Gynecology

## 2016-03-12 ENCOUNTER — Encounter: Payer: Self-pay | Admitting: Obstetrics and Gynecology

## 2016-03-12 ENCOUNTER — Telehealth: Payer: Self-pay | Admitting: Obstetrics and Gynecology

## 2016-03-12 ENCOUNTER — Telehealth: Payer: Self-pay | Admitting: *Deleted

## 2016-03-12 VITALS — BP 122/60 | HR 76 | Resp 15 | Wt 174.0 lb

## 2016-03-12 DIAGNOSIS — R300 Dysuria: Secondary | ICD-10-CM | POA: Diagnosis not present

## 2016-03-12 DIAGNOSIS — R350 Frequency of micturition: Secondary | ICD-10-CM | POA: Diagnosis not present

## 2016-03-12 DIAGNOSIS — L293 Anogenital pruritus, unspecified: Secondary | ICD-10-CM | POA: Diagnosis not present

## 2016-03-12 DIAGNOSIS — N898 Other specified noninflammatory disorders of vagina: Secondary | ICD-10-CM

## 2016-03-12 LAB — POCT URINALYSIS DIPSTICK
Bilirubin, UA: NEGATIVE
Blood, UA: NEGATIVE
Glucose, UA: NEGATIVE
KETONES UA: NEGATIVE
Leukocytes, UA: NEGATIVE
NITRITE UA: NEGATIVE
PH UA: 6.5
PROTEIN UA: NEGATIVE
Urobilinogen, UA: NEGATIVE

## 2016-03-12 LAB — WET PREP BY MOLECULAR PROBE
CANDIDA SPECIES: NEGATIVE
GARDNERELLA VAGINALIS: NEGATIVE
TRICHOMONAS VAG: NEGATIVE

## 2016-03-12 MED ORDER — BETAMETHASONE VALERATE 0.1 % EX OINT: TOPICAL_OINTMENT | CUTANEOUS | Status: DC

## 2016-03-12 MED ORDER — NITROFURANTOIN MONOHYD MACRO 100 MG PO CAPS: 100.0000 mg | ORAL_CAPSULE | Freq: Two times a day (BID) | ORAL | Status: DC

## 2016-03-12 MED ORDER — PHENAZOPYRIDINE HCL 200 MG PO TABS: ORAL_TABLET | ORAL | Status: DC

## 2016-03-12 NOTE — Telephone Encounter (Signed)
Spoke with Dr. Talbert Nan, patient needs to be seen. I called patient back and made her an appointment for 11:30 today-eh

## 2016-03-12 NOTE — Telephone Encounter (Signed)
Spoke with patient. Patient states that Pyridium is not covered by her insurance. Advised she may use over the counter AZO standard 100 mg, take 2 tablets by mouth, three times a day for 2 days. Advised this is the same as Pyridium. She is agreeable and verbalizes understanding.  Routing to provider for final review. Patient agreeable to disposition. Will close encounter.

## 2016-03-12 NOTE — Progress Notes (Signed)
GYNECOLOGY  VISIT   HPI: 45 y.o.   Divorced  Caucasian  female   [phone] with No LMP recorded. Patient is not currently having periods (Reason: IUD).   here c/o dysuria, urinary frequency and vaginal itching. She is also having RLQ pain. She c/o vaginal irritation and itching is severe, started last night. Also having right flank pain. Over the last week she c/o urinary frequency, voiding small amounts, dysuria (not sure if external). No fevers. No abnormal d/c. She was treated for a UTI last month.      GYNECOLOGIC HISTORY: No LMP recorded. Patient is not currently having periods (Reason: IUD). Contraception:IUD Menopausal hormone therapy: none         OB History    Gravida Para Term Preterm AB TAB SAB Ectopic Multiple Living   3 2 2  1     2          There are no active problems to display for this patient.   Past Medical History  Diagnosis Date  . Anxiety   . Depression     Past Surgical History  Procedure Laterality Date  . Wisdom tooth extraction    . Intrauterine device insertion      Current Outpatient Prescriptions  Medication Sig Dispense Refill  . buPROPion (WELLBUTRIN XL) 300 MG 24 hr tablet Take 1 tablet (300 mg total) by mouth daily. 90 tablet 2  . levonorgestrel (MIRENA) 20 MCG/24HR IUD 1 each by Intrauterine route once.    . betamethasone valerate ointment (VALISONE) 0.1 % Place a pea sized amount topically BID x 1-2 weeks as needed 15 g 0  . nitrofurantoin, macrocrystal-monohydrate, (MACROBID) 100 MG capsule Take 1 capsule (100 mg total) by mouth 2 (two) times daily. 14 capsule 0  . phenazopyridine (PYRIDIUM) 200 MG tablet 1 tab po TID x 2 days 6 tablet 0   No current facility-administered medications for this visit.     ALLERGIES: Tetracyclines & related  Family History  Problem Relation Age of Onset  . Diabetes Mother   . Kidney cancer Mother   . Congestive Heart Failure Mother     Social History   Social History  . Marital Status: Divorced    Spouse Name: N/A  . Number of Children: N/A  . Years of Education: N/A   Occupational History  . Not on file.   Social History Main Topics  . Smoking status: Former Research scientist (life sciences)  . Smokeless tobacco: Never Used  . Alcohol Use: 0.0 oz/week    0 Standard drinks or equivalent per week     Comment: social  . Drug Use: No  . Sexual Activity:    Partners: Male    Patent examiner Protection: IUD   Other Topics Concern  . Not on file   Social History Narrative    Review of Systems  Constitutional: Negative.   HENT: Negative.   Eyes: Negative.   Respiratory: Negative.   Cardiovascular: Negative.   Gastrointestinal: Negative.   Genitourinary: Positive for dysuria, urgency, frequency and flank pain.       RLQ pain  Musculoskeletal: Negative.   Skin: Negative.   Neurological: Negative.   Endo/Heme/Allergies: Negative.   Psychiatric/Behavioral: Negative.     PHYSICAL EXAMINATION:    BP 122/60 mmHg  Pulse 76  Resp 15  Wt 174 lb (78.926 kg)    General appearance: alert, cooperative and appears stated age Abdomen: soft, mildly tender in the suprapubic region, no rebound, no guarding. ; no masses,  no organomegaly  Pelvic: External genitalia:  no lesions              Urethra:  normal appearing urethra with no masses, tenderness or lesions              Bartholins and Skenes: normal                 Vagina: normal appearing vagina with normal color and discharge, no lesions              Cervix: no lesions and IUD string 2-3 cm              Bimanual Exam:  Uterus:  normal size, contour, position, consistency, mobility, non-tender              Adnexa: no mass, fullness, tenderness              Bladder tender to palpation  Chaperone was present for exam.  Urine dip negative  Wet prep: ? clue, no trich, +++ wbc KOH: no yeast PH: 4   ASSESSMENT Urinary frequency, urgency, bladder discomfort, dysuria (?external). Even with negative urine dip, her symptoms are very concerning for a  UTI Genital pruritus, suspect yeast, not seen on vaginal slides Vaginal irritaiton   PLAN Send urine for ua, c&s Wet prep probe Will treat for presumed UTI given severity of symptoms (macrobid and pyridium) Steroids for pruritus, further treatment depending on lab results   An After Visit Summary was printed and given to the patient.

## 2016-03-12 NOTE — Telephone Encounter (Signed)
Patient called and said her UTI symptoms are back. She is requesting ABX called in.

## 2016-03-12 NOTE — Telephone Encounter (Signed)
Patient was just in to see Dr. Talbert Nan and her prescription is not covered by insurance. Would like something else called in if possible.

## 2016-03-13 LAB — URINALYSIS, MICROSCOPIC ONLY
Bacteria, UA: NONE SEEN [HPF]
CASTS: NONE SEEN [LPF]
Crystals: NONE SEEN [HPF]
RBC / HPF: NONE SEEN RBC/HPF (ref <=2)
WBC, UA: NONE SEEN WBC/HPF (ref <=5)
YEAST: NONE SEEN [HPF]

## 2016-03-13 LAB — URINE CULTURE: Colony Count: 2000

## 2016-03-18 ENCOUNTER — Telehealth: Payer: Self-pay | Admitting: Obstetrics and Gynecology

## 2016-03-18 NOTE — Telephone Encounter (Signed)
Entered by Salvadore Dom, MD at 03/15/2016 2:42 PM    Hi Cathy Schroeder,  Your vaginitis probe was negative. The steroid ointment should correct the itching. Let me know if you aren't feeling better next week.  Your urine culture is still pending.  [place] Boast  [facility],  Your urine culture is negative, you do not have a urinary tract infection. You should stop the antibiotics. If you aren't feeling better, please call the office.  Sumner Boast    Attempted to reach patient at number provided, [phone], the recording states that the voicemail box is currently full and is not accepting new messages at this time.

## 2016-03-18 NOTE — Telephone Encounter (Signed)
Patient called and said, "When my results are ready, please just call and leave the result details on my voicemail. I am at work and that is the only way I can get them. I'll call back if I have any questions."

## 2016-03-19 ENCOUNTER — Encounter: Payer: Self-pay | Admitting: Obstetrics and Gynecology

## 2016-03-20 ENCOUNTER — Telehealth: Payer: Self-pay

## 2016-03-20 NOTE — Telephone Encounter (Signed)
Non-Urgent Medical Question  Message [phone]   From  [facility]   To  Salvadore Dom, MD   Sent  03/19/2016 1:01 PM     I received your message saying results are on my chart.. I'm glad to see my tests are negative however I'm still having the urge and pressure to urinate all the time.. my vagina does not hurt like it did before I started this last round of antibiotics. I feel better but it still feels as if something is wrong. I am leaving for the beach for a long weekend. If you feel there could be something else we could do to stop the feeling of having to pee every 30 minutes I would love to hear it   Thank you  [place] No one has taken responsibility for this message.     No actions have been taken on this message.     Patient was seen in office on 03/12/2016. Urine culture was sent for urinary symptoms which returned negative. Patient reports she is still having urinary urgency and pressure. Please review and advise as Dr.Jertson is out of the office today.

## 2016-03-20 NOTE — Telephone Encounter (Signed)
Spoke with patient. Advised of message as seen below from [place]. Patient is agreeable and verbalizes understanding. Appointment scheduled for 03/26/2016 at 9:30 am with Dr.Jertson. Patient is agreeable to date and time. Asking if she may also have her glucose levels checked at her appointment as she is worried about diabetes. Reports she has been fatigued lately as well and is concerned. Advised she may discuss labs with Dr.Jertson at her appointment and recommended lab work can be performed. She is agreeable.  Cc: Dr.Jerton  Routing to covering provider for final review. Patient agreeable to disposition. Will close encounter.

## 2016-03-20 NOTE — Telephone Encounter (Signed)
Spoke with patient. Please see telephone encounter dated with today's date. 

## 2016-03-20 NOTE — Telephone Encounter (Signed)
I would recommend eliminating any bladder irritants - caffeine, carbonation, citrus, spice, alcohol.  This may decrease the urinary frequency.  Please have the patient make a follow up appointment with Dr. Talbert Nan for further evaluation.

## 2016-03-23 NOTE — Telephone Encounter (Signed)
Please see telephone encounter dated 03/20/2016. Patient has seen her results given mychart and has been given further recommendations. She is scheduled to see Dr.Jertson for follow up on 03/26/2016 at 9:30 am.  Routing to provider for final review. Patient agreeable to disposition. Will close encounter.

## 2016-03-25 ENCOUNTER — Telehealth: Payer: Self-pay | Admitting: Obstetrics and Gynecology

## 2016-03-25 NOTE — Telephone Encounter (Signed)
Patient cancelled her appointment for problem visit. Says she is feeling so much better after cutting back the amount of caffeine she was drinking.

## 2016-03-26 ENCOUNTER — Ambulatory Visit: Payer: BLUE CROSS/BLUE SHIELD | Admitting: Obstetrics and Gynecology

## 2016-05-21 DIAGNOSIS — R030 Elevated blood-pressure reading, without diagnosis of hypertension: Secondary | ICD-10-CM | POA: Insufficient documentation

## 2016-05-21 DIAGNOSIS — B354 Tinea corporis: Secondary | ICD-10-CM | POA: Insufficient documentation

## 2016-05-21 DIAGNOSIS — F419 Anxiety disorder, unspecified: Secondary | ICD-10-CM | POA: Insufficient documentation

## 2016-05-21 DIAGNOSIS — R350 Frequency of micturition: Secondary | ICD-10-CM | POA: Insufficient documentation

## 2016-05-21 DIAGNOSIS — E663 Overweight: Secondary | ICD-10-CM | POA: Insufficient documentation

## 2016-05-21 DIAGNOSIS — M5412 Radiculopathy, cervical region: Secondary | ICD-10-CM | POA: Insufficient documentation

## 2016-08-26 ENCOUNTER — Encounter: Payer: Self-pay | Admitting: Obstetrics and Gynecology

## 2016-09-14 ENCOUNTER — Encounter: Payer: Self-pay | Admitting: Obstetrics and Gynecology

## 2016-11-01 IMAGING — CR DG CHEST 2V
2 series · 2 of 2 positions shown · non-contrast
Comparison: None.

CLINICAL DATA: Positive TB test.

EXAM:
CHEST  2 VIEW

[w chest pa]
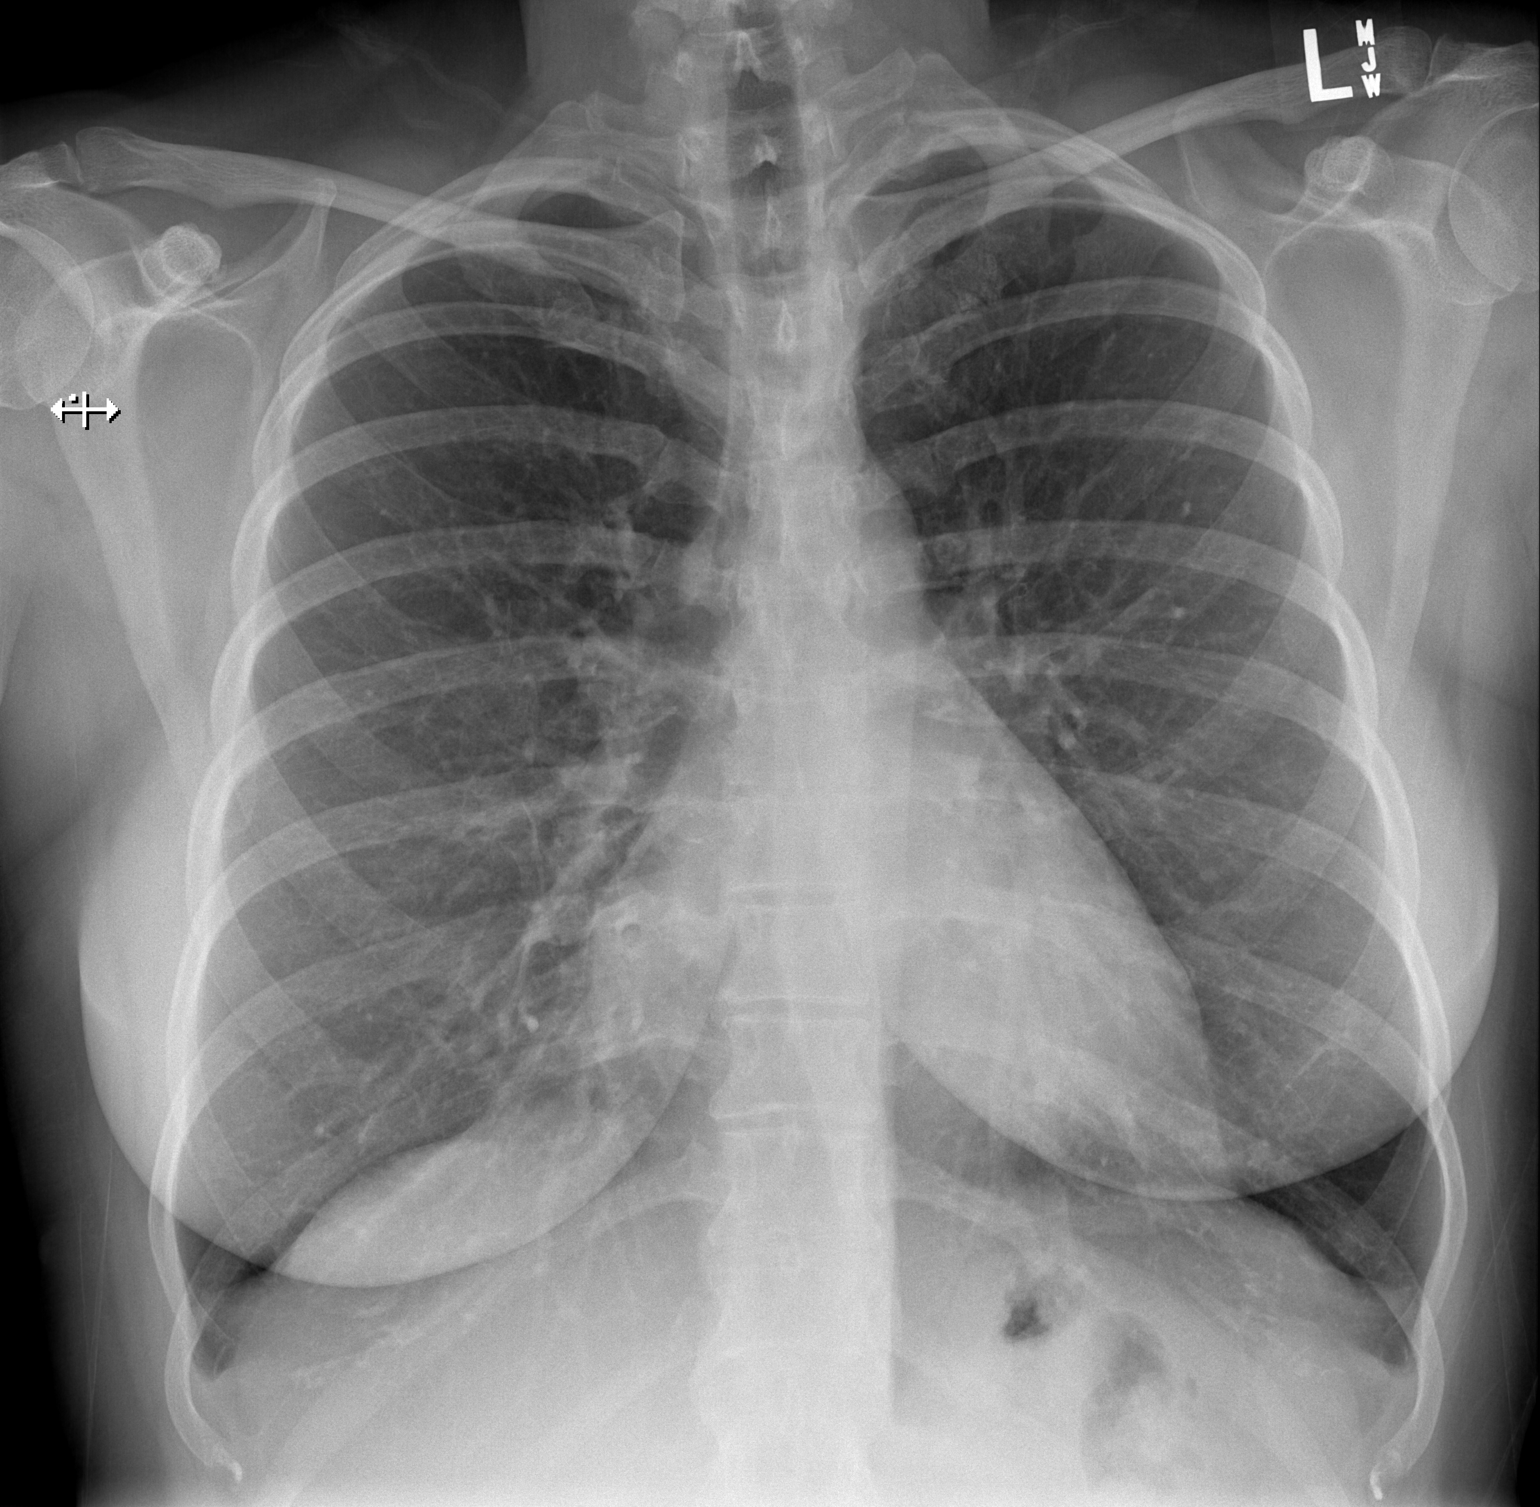

[w chest lat]
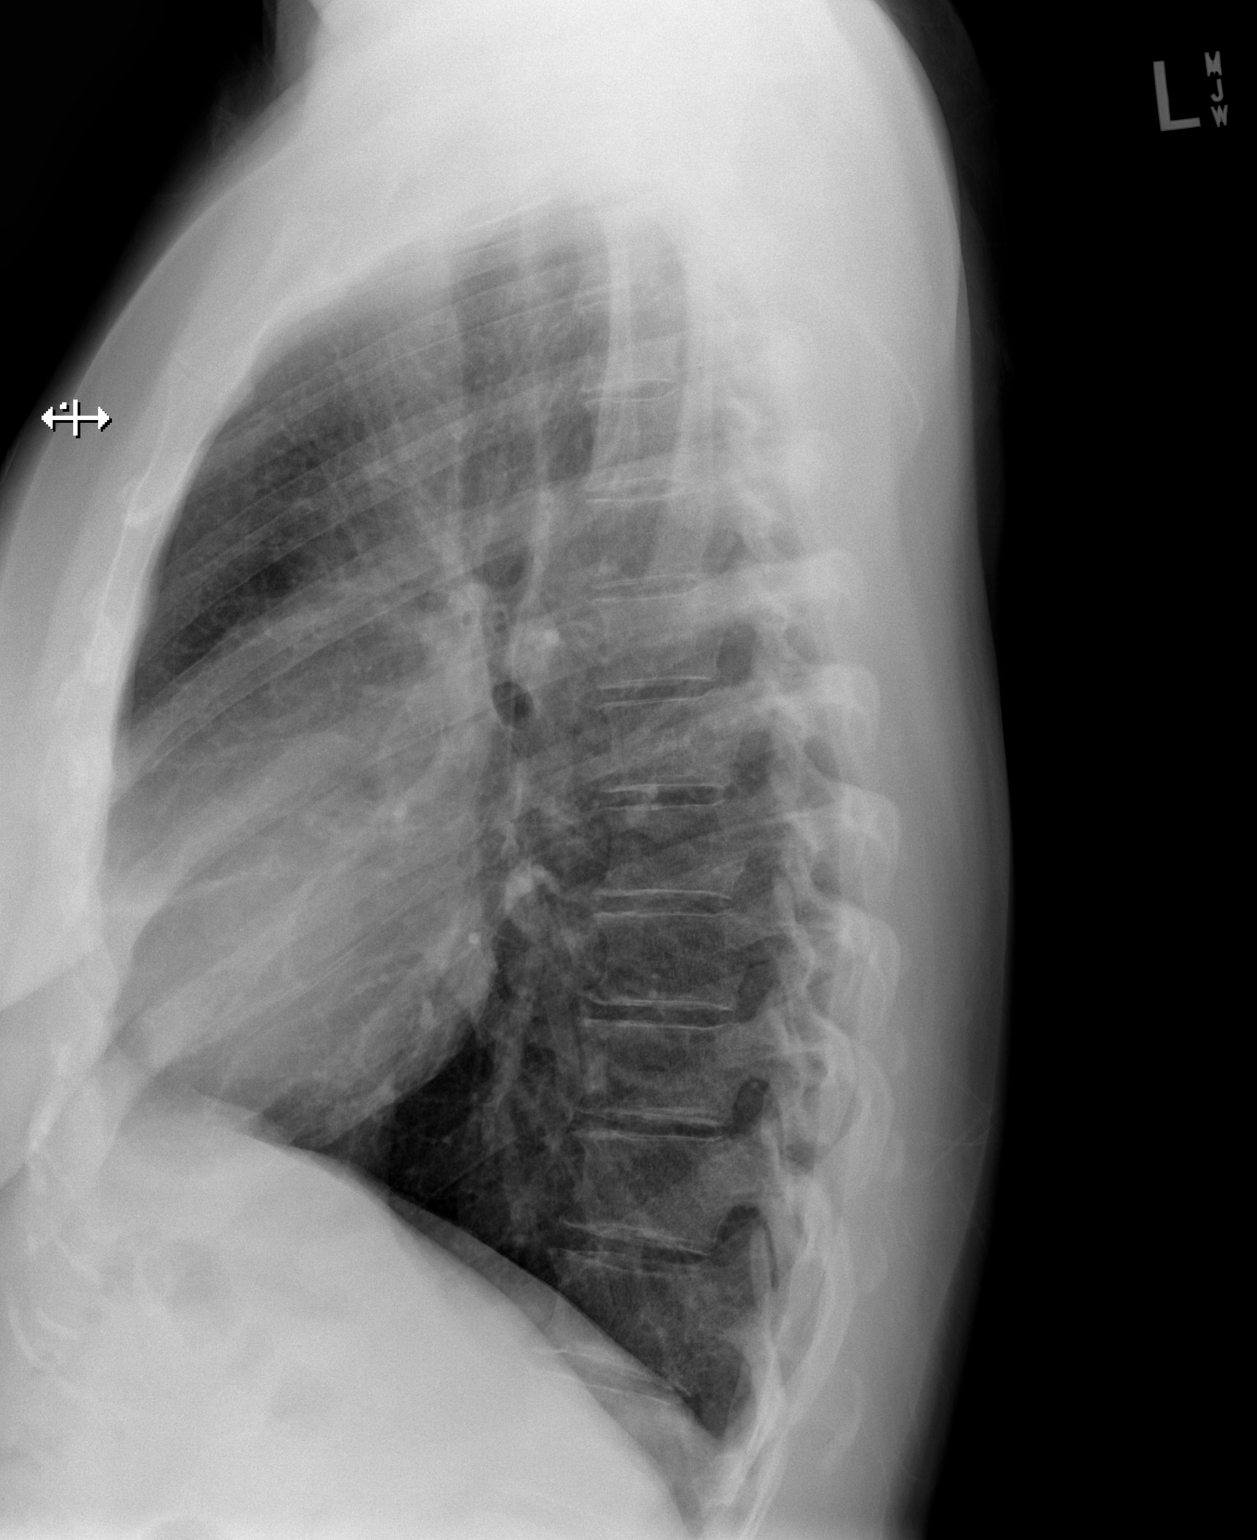

[2 of 2 positions shown; findings below may reference images not displayed]

FINDINGS: The heart size and mediastinal contours are within normal limits.
There are tiny calcified granulomas in the left upper lobe. There is
no focal pneumonia, pulmonary edema, or pleural effusion. The
visualized skeletal structures are unremarkable.
IMPRESSION: No active cardiopulmonary disease. Tiny calcified granulomas in the
left upper lobe.

## 2016-11-07 ENCOUNTER — Other Ambulatory Visit: Payer: Self-pay | Admitting: Obstetrics and Gynecology

## 2016-11-09 NOTE — Telephone Encounter (Signed)
Medication refill request: wellbutrin  Last AEX:  11/12/15 JJ Next AEX: none Last MMG (if hormonal medication request): 08/07/16 BIRADS3:Probably benign  Refill authorized: 02/07/16 #[id number]. Today please advise.

## 2016-11-09 NOTE — Telephone Encounter (Signed)
1 month script sent. Please have her set up her annual

## 2016-11-10 NOTE — Telephone Encounter (Signed)
Voicemail full and cannot leave message. Will try again later.

## 2016-12-07 ENCOUNTER — Other Ambulatory Visit: Payer: Self-pay

## 2016-12-07 MED ORDER — BUPROPION HCL ER (XL) 300 MG PO TB24: 300.0000 mg | ORAL_TABLET | Freq: Every day | ORAL | 0 refills | Status: DC

## 2016-12-07 NOTE — Telephone Encounter (Signed)
Medication refill request: Bupropion Last AEX:  11/12/15 JJ Next AEX: 12/15/16 JJ Last MMG (if hormonal medication request): 08/07/16 BIRADS3, Density C, [facility]. Recommends 6 month f/u.  Refill authorized: 11/09/16 #[id number]. Please advise. Thank you.

## 2016-12-15 ENCOUNTER — Encounter: Payer: Self-pay | Admitting: Obstetrics and Gynecology

## 2016-12-15 ENCOUNTER — Ambulatory Visit (INDEPENDENT_AMBULATORY_CARE_PROVIDER_SITE_OTHER): Payer: BLUE CROSS/BLUE SHIELD | Admitting: Obstetrics and Gynecology

## 2016-12-15 VITALS — BP 130/82 | HR 80 | Resp 16 | Ht 65.5 in | Wt 180.0 lb

## 2016-12-15 DIAGNOSIS — R350 Frequency of micturition: Secondary | ICD-10-CM | POA: Diagnosis not present

## 2016-12-15 DIAGNOSIS — Z0000 Encounter for general adult medical examination without abnormal findings: Secondary | ICD-10-CM

## 2016-12-15 DIAGNOSIS — Z833 Family history of diabetes mellitus: Secondary | ICD-10-CM | POA: Diagnosis not present

## 2016-12-15 DIAGNOSIS — Z30431 Encounter for routine checking of intrauterine contraceptive device: Secondary | ICD-10-CM

## 2016-12-15 DIAGNOSIS — K5900 Constipation, unspecified: Secondary | ICD-10-CM

## 2016-12-15 DIAGNOSIS — E559 Vitamin D deficiency, unspecified: Secondary | ICD-10-CM

## 2016-12-15 DIAGNOSIS — Z01419 Encounter for gynecological examination (general) (routine) without abnormal findings: Secondary | ICD-10-CM

## 2016-12-15 DIAGNOSIS — L293 Anogenital pruritus, unspecified: Secondary | ICD-10-CM | POA: Diagnosis not present

## 2016-12-15 LAB — CBC
HEMATOCRIT: 40.8 % (ref 35.0–45.0)
Hemoglobin: 13.1 g/dL (ref 11.7–15.5)
MCH: 28.8 pg (ref 27.0–33.0)
MCHC: 32.1 g/dL (ref 32.0–36.0)
MCV: 89.7 fL (ref 80.0–100.0)
MPV: 11.3 fL (ref 7.5–12.5)
Platelets: 246 10*3/uL (ref 140–400)
RBC: 4.55 MIL/uL (ref 3.80–5.10)
RDW: 13.3 % (ref 11.0–15.0)
WBC: 7 10*3/uL (ref 3.8–10.8)

## 2016-12-15 LAB — POCT URINALYSIS DIPSTICK
BILIRUBIN UA: NEGATIVE
Blood, UA: NEGATIVE
Glucose, UA: NEGATIVE
Ketones, UA: NEGATIVE
Nitrite, UA: NEGATIVE
Protein, UA: NEGATIVE
UROBILINOGEN UA: NEGATIVE U/dL — AB
pH, UA: 6 (ref 5.0–8.0)

## 2016-12-15 LAB — COMPREHENSIVE METABOLIC PANEL
ALBUMIN: 4.2 g/dL (ref 3.6–5.1)
ALK PHOS: 59 U/L (ref 33–115)
ALT: 12 U/L (ref 6–29)
AST: 13 U/L (ref 10–30)
BUN: 11 mg/dL (ref 7–25)
CO2: 23 mmol/L (ref 20–31)
CREATININE: 0.9 mg/dL (ref 0.50–1.10)
Calcium: 9.4 mg/dL (ref 8.6–10.2)
Chloride: 105 mmol/L (ref 98–110)
Glucose, Bld: 92 mg/dL (ref 65–99)
Potassium: 4.5 mmol/L (ref 3.5–5.3)
Sodium: 139 mmol/L (ref 135–146)
TOTAL PROTEIN: 7.3 g/dL (ref 6.1–8.1)
Total Bilirubin: 0.5 mg/dL (ref 0.2–1.2)

## 2016-12-15 LAB — URINALYSIS, MICROSCOPIC ONLY
CRYSTALS: NONE SEEN [HPF]
Casts: NONE SEEN [LPF]
RBC / HPF: NONE SEEN RBC/HPF (ref <=2)
Yeast: NONE SEEN [HPF]

## 2016-12-15 LAB — LIPID PANEL
CHOLESTEROL: 129 mg/dL (ref <200)
HDL: 37 mg/dL — ABNORMAL LOW (ref >50)
LDL Cholesterol: 75 mg/dL (ref <100)
TRIGLYCERIDES: 85 mg/dL (ref <150)
Total CHOL/HDL Ratio: 3.5 Ratio (ref <5.0)
VLDL: 17 mg/dL (ref <30)

## 2016-12-15 MED ORDER — BUPROPION HCL ER (XL) 300 MG PO TB24: 300.0000 mg | ORAL_TABLET | Freq: Every day | ORAL | 3 refills | Status: DC

## 2016-12-15 NOTE — Patient Instructions (Signed)
You can try Magnesium 250-500 mg a day for constipation  If that doesn't work, you can metamucil or miralax   About Constipation  Constipation Overview Constipation is the most common gastrointestinal complaint - about 4 million Americans experience constipation and make 2.5 million physician visits a year to get help for the problem.  Constipation can occur when the colon absorbs too much water, the colon's muscle contraction is slow or sluggish, and/or there is delayed transit time through the colon.  The result is stool that is hard and dry.  Indicators of constipation include straining during bowel movements greater than 25% of the time, having fewer than three bowel movements per week, and/or the feeling of incomplete evacuation.  There are established guidelines ([place] II ) for defining constipation. A person needs to have two or more of the following symptoms for at least 12 weeks (not necessarily consecutive) in the preceding 12 months: . Straining in  greater than 25% of bowel movements . Lumpy or hard stools in greater than 25% of bowel movements . Sensation of incomplete emptying in greater than 25% of bowel movements . Sensation of anorectal obstruction/blockade in greater than 25% of bowel movements . Manual maneuvers to help empty greater than 25% of bowel movements (e.g., digital evacuation, support of the pelvic floor)  . Less than  3 bowel movements/week . Loose stools are not present, and criteria for irritable bowel syndrome are insufficient  Common Causes of Constipation . Lack of fiber in your diet . Lack of physical activity . Medications, including iron and calcium supplements  . Dairy intake . Dehydration . Abuse of laxatives  Travel  Irritable Bowel Syndrome  Pregnancy  Luteal phase of menstruation (after ovulation and before menses)  Colorectal problems  Intestinal Dysfunction  Treating Constipation  There are several ways of treating constipation,  including changes to diet and exercise, use of laxatives, adjustments to the pelvic floor, and scheduled toileting.  These treatments include: . increasing fiber and fluids in the diet  . increasing physical activity . learning muscle coordination   learning proper toileting techniques and toileting modifications   designing and sticking  to a toileting schedule     2007, [employer] Doc.22  EXERCISE AND DIET:  We recommended that you start or continue a regular exercise program for good health. Regular exercise means any activity that makes your heart beat faster and makes you sweat.  We recommend exercising at least 30 minutes per day at least 3 days a week, preferably 4 or 5.  We also recommend a diet low in fat and sugar.  Inactivity, poor dietary choices and obesity can cause diabetes, heart attack, stroke, and kidney damage, among others.    ALCOHOL AND SMOKING:  Women should limit their alcohol intake to no more than 7 drinks/beers/glasses of wine (combined, not each!) per week. Moderation of alcohol intake to this level decreases your risk of breast cancer and liver damage. And of course, no recreational drugs are part of a healthy lifestyle.  And absolutely no smoking or even second hand smoke. Most people know smoking can cause heart and lung diseases, but did you know it also contributes to weakening of your bones? Aging of your skin?  Yellowing of your teeth and nails?  CALCIUM AND VITAMIN D:  Adequate intake of calcium and Vitamin D are recommended.  The recommendations for exact amounts of these supplements seem to change often, but generally speaking 600 mg of calcium (either carbonate or citrate)  and 800 units of Vitamin D per day seems prudent. Certain women may benefit from higher intake of Vitamin D.  If you are among these women, your doctor will have told you during your visit.    PAP SMEARS:  Pap smears, to check for cervical cancer or precancers,  have  traditionally been done yearly, although recent scientific advances have shown that most women can have pap smears less often.  However, every woman still should have a physical exam from her gynecologist every year. It will include a breast check, inspection of the vulva and vagina to check for abnormal growths or skin changes, a visual exam of the cervix, and then an exam to evaluate the size and shape of the uterus and ovaries.  And after 45 years of age, a rectal exam is indicated to check for rectal cancers. We will also provide age appropriate advice regarding health maintenance, like when you should have certain vaccines, screening for sexually transmitted diseases, bone density testing, colonoscopy, mammograms, etc.   MAMMOGRAMS:  All women over 48 years old should have a yearly mammogram. Many facilities now offer a "3D" mammogram, which may cost around $50 extra out of pocket. If possible,  we recommend you accept the option to have the 3D mammogram performed.  It both reduces the number of women who will be called back for extra views which then turn out to be normal, and it is better than the routine mammogram at detecting truly abnormal areas.    COLONOSCOPY:  Colonoscopy to screen for colon cancer is recommended for all women at age 43.  We know, you hate the idea of the prep.  We agree, BUT, having colon cancer and not knowing it is worse!!  Colon cancer so often starts as a polyp that can be seen and removed at colonscopy, which can quite literally save your life!  And if your first colonoscopy is normal and you have no family history of colon cancer, most women don't have to have it again for 10 years.  Once every ten years, you can do something that may end up saving your life, right?  We will be happy to help you get it scheduled when you are ready.  Be sure to check your insurance coverage so you understand how much it will cost.  It may be covered as a preventative service at no cost, but  you should check your particular policy.

## 2016-12-15 NOTE — Progress Notes (Signed)
46 y.o. [id number] DivorcedCaucasianF here for annual exam.  Sexually active, same partner x 1.5 years. No dyspareunia. No regular cycles, only one episode of bleeding in years and that was a few weeks ago. The bleeding last x 2 days, light. No intercourse prior to the bleeding.  Depression is well controlled with the Wellbutrin. Still having anxiety. C/O intermittent vulvar pruritus.     Patient's last menstrual period was 12/04/2016.          Sexually active: Yes.    The current method of family planning is IUD (Mirena).    Exercising: No.  The patient does not participate in regular exercise at present. Smoker:  Former   Health Maintenance: Pap:  11-12-15 WNL NEG HR HPV History of abnormal Pap:  no MMG:  08-07-16 DX MMG -6 month F/U left breast recommended  Colonoscopy:  Never BMD:   Never TDaP:  11-12-15 Gardasil: N/A   reports that she has quit smoking. She has never used smokeless tobacco. She reports that she drinks alcohol. She reports that she does not use drugs.Daughters are 31 and 75. 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 Anxiety   . Depression     Past Surgical History:  Procedure Laterality Date  . INTRAUTERINE DEVICE INSERTION    . WISDOM TOOTH EXTRACTION      Current Outpatient Prescriptions  Medication Sig Dispense Refill  . buPROPion (WELLBUTRIN XL) 300 MG 24 hr tablet Take 1 tablet (300 mg total) by mouth daily. 30 tablet 0  . levonorgestrel (MIRENA) 20 MCG/24HR IUD 1 each by Intrauterine route once.     No current facility-administered medications for this visit.     Family History  Problem Relation Age of Onset  . Diabetes Mother   . Kidney cancer Mother   . Congestive Heart Failure Mother     Review of Systems  Constitutional: Positive for unexpected weight change.       Weight gain  HENT: Negative.   Eyes: Negative.   Respiratory: Negative.   Cardiovascular:  Negative.   Gastrointestinal: Positive for constipation.       Bloating   Endocrine: Negative.   Genitourinary: Positive for frequency.       Unscheduled bleeding Slight vaginal itching   Musculoskeletal: Negative.   Skin: Negative.   Allergic/Immunologic: Negative.   Neurological: Negative.   Psychiatric/Behavioral: Negative.   She c/o bloating and constipation, has a BM every couple of days in the last month.  Urinary frequency for the last 8 months, can be normal or small amounts. Can go up to 3 times in 75 minutes at times. No leakage. No pain. No urgency to void.  She drinks one to two cups of coffee a morning and one soda in the afternoon.    Exam:   BP 130/82 (BP Location: Right Arm, Patient Position: Sitting, Cuff Size: Normal)   Pulse 80   Resp 16   Ht 5' 5.5" (1.664 m)   Wt 180 lb (81.6 kg)   LMP 12/04/2016   BMI 29.50 kg/m   Weight change: @WEIGHTCHANGE @ Height:   Height: 5' 5.5" (166.4 cm)  Ht Readings from Last 3 Encounters:  12/15/16 5' 5.5" (1.664 m)  01/23/16 5\' 5"  (1.651 m)  11/12/15 5' 5.5" (1.664 m)    General appearance: alert, cooperative and appears stated age Head: Normocephalic, without obvious abnormality, atraumatic Neck: no adenopathy, supple, symmetrical, trachea midline  and thyroid normal to inspection and palpation Lungs: clear to auscultation bilaterally Cardiovascular: regular rate and rhythm Breasts: normal appearance, no masses or tenderness Abdomen: soft, non-tender; bowel sounds normal; no masses,  no organomegaly Extremities: extremities normal, atraumatic, no cyanosis or edema Skin: Skin color, texture, turgor normal. No rashes or lesions Lymph nodes: Cervical, supraclavicular, and axillary nodes normal. No abnormal inguinal nodes palpated Neurologic: Grossly normal   Pelvic: External genitalia:  no lesions              Urethra:  normal appearing urethra with no masses, tenderness or lesions              Bartholins and Skenes:  normal                 Vagina: normal appearing vagina with normal color and discharge, no lesions              Cervix: no lesions and IUD string 3 cm               Bimanual Exam:  Uterus:  normal size, contour, position, consistency, mobility, non-tender and anteverted              Adnexa: no mass, fullness, tenderness               Rectovaginal: Confirms               Anus:  normal sphincter tone, no lesions  Chaperone was present for exam.  A:  Well Woman with normal exam  Vit d Def  Family history of diabetes  Urinary frequency  Constipation  IUD check, one recent episode of bleeding, no other c/o. String length appropriate  Intermittent genital pruritus  Depression, well controlled on Wellbutrin, will continue  C/O anxiety, will f/u with primary MD (names given)    P:   No pap this year  Mammogram, f/u due in 6/18  Discussed breast self exam  Discussed calcium and vit D intake  Screening labs  Discussed avoiding caffeine, discussed bladder training  Send urine for ua, c&s  Discussed fruit, fiber and fluid for constipation. Also discussed magnesium supplements, miralax  and metamucil  Keep track of bleeding, call with concerns  Wet prep probe, discussed using Vaseline externally with mild symptoms

## 2016-12-16 LAB — VITAMIN D 25 HYDROXY (VIT D DEFICIENCY, FRACTURES): Vit D, 25-Hydroxy: 26 ng/mL — ABNORMAL LOW (ref 30–100)

## 2016-12-16 LAB — WET PREP BY MOLECULAR PROBE
CANDIDA SPECIES: NOT DETECTED
Gardnerella vaginalis: NOT DETECTED
Trichomonas vaginosis: NOT DETECTED

## 2016-12-16 LAB — HEMOGLOBIN A1C
HEMOGLOBIN A1C: 5.3 % (ref <5.7)
MEAN PLASMA GLUCOSE: 105 mg/dL

## 2016-12-17 LAB — URINE CULTURE

## 2017-01-19 ENCOUNTER — Telehealth: Payer: Self-pay | Admitting: Family Medicine

## 2017-01-19 ENCOUNTER — Encounter: Payer: Self-pay | Admitting: Family Medicine

## 2017-01-19 ENCOUNTER — Ambulatory Visit (INDEPENDENT_AMBULATORY_CARE_PROVIDER_SITE_OTHER): Payer: BLUE CROSS/BLUE SHIELD | Admitting: Family Medicine

## 2017-01-19 VITALS — BP 121/76 | HR 77 | Temp 98.5°F | Ht 65.0 in | Wt 177.0 lb

## 2017-01-19 DIAGNOSIS — F4323 Adjustment disorder with mixed anxiety and depressed mood: Secondary | ICD-10-CM | POA: Insufficient documentation

## 2017-01-19 DIAGNOSIS — G479 Sleep disorder, unspecified: Secondary | ICD-10-CM

## 2017-01-19 DIAGNOSIS — E559 Vitamin D deficiency, unspecified: Secondary | ICD-10-CM | POA: Diagnosis not present

## 2017-01-19 DIAGNOSIS — Z8249 Family history of ischemic heart disease and other diseases of the circulatory system: Secondary | ICD-10-CM

## 2017-01-19 DIAGNOSIS — D1801 Hemangioma of skin and subcutaneous tissue: Secondary | ICD-10-CM | POA: Insufficient documentation

## 2017-01-19 DIAGNOSIS — R41840 Attention and concentration deficit: Secondary | ICD-10-CM | POA: Insufficient documentation

## 2017-01-19 DIAGNOSIS — Z833 Family history of diabetes mellitus: Secondary | ICD-10-CM | POA: Insufficient documentation

## 2017-01-19 DIAGNOSIS — E663 Overweight: Secondary | ICD-10-CM

## 2017-01-19 DIAGNOSIS — I781 Nevus, non-neoplastic: Secondary | ICD-10-CM

## 2017-01-19 MED ORDER — ATOMOXETINE HCL 10 MG PO CAPS: 10.0000 mg | ORAL_CAPSULE | Freq: Every day | ORAL | 0 refills | Status: DC

## 2017-01-19 NOTE — Telephone Encounter (Signed)
The only other non-stimulant that I would rec trying ( beyond her wellbutrin she is already on)  is intuniv 1mg  daily.  But please tell her to call her insurance and check on cost first b/c I doubt it is a lot less costly than the prior.  Due to her anxiety sx, I do not recommend stimulant meds at this time- so there are no other meds I am comfortable prescribing at this time.    I am happy to refer her to the ADD clinic for evaluation with a specialist if she would like.  ( we discussed this at her OV)

## 2017-01-19 NOTE — Telephone Encounter (Signed)
Patient states that the med that was sent in today will cost her over $400 and is wondering if there is something comparable that would be less costly.

## 2017-01-19 NOTE — Assessment & Plan Note (Signed)
Counseling done on usual course for benign cherry hemangiomas.  Explained to patient no need to remove, but I highly recommend she avoid trauma to area or playing with it.

## 2017-01-19 NOTE — Assessment & Plan Note (Addendum)
Has been an issue ever since grade school for patient.  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  Will trial Strattera- follow-up 6 weeks - call us sooner if problems  - Adult ADHD self report scale symptom checklist performed in the office today- see results under "screening"- "ASRS"  - Explained the connection between stress and detrimental effects it has on focus.  Recommend stress management techniques such as transcendental meditation exercises, counseling etc, .

## 2017-01-19 NOTE — Progress Notes (Signed)
New patient office visit note:  Impression and Recommendations:    1. Overweight (BMI 25.0-29.9)   2. Family history of early CAD   3. Family history of diabetes mellitus in first degree relative- MOM- mid-40   4. Adjustment disorder with mixed anxiety and depressed mood   5. Attention or concentration deficit   6. Cherry hemangioma- mid back ( 94mm or less )   7. Vitamin D insufficiency   8. Sleeping difficulties-due to stress     Adjustment disorder with mixed anxiety and depressed mood Continue Wellbutrin.  At current doses patient feels works well.  Declines addition of buspar today  Patient tearful in the office today and I recommended she exercises regularly, try transcendental meditation, eat a healthy diet, get regular sleep,  engage in CBT.  Highly rec counseling      Attention or concentration deficit Has been an issue ever since grade school for patient.  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  Will trial Strattera- follow-up 6 weeks - call us sooner if problems  - Adult ADHD self report scale symptom checklist performed in the office today- see results under "screening"- "ASRS"  - Explained the connection between stress and detrimental effects it has on focus.  Recommend stress management techniques such as transcendental meditation exercises, counseling etc, .      Cherry hemangioma Counseling done on usual course for benign cherry hemangiomas.  Explained to patient no need to remove, but I highly recommend she avoid trauma to area or playing with it.    Sleeping difficulties-due to stress Sleep hygiene discussed and handouts provided.  Avoid blue light of computer and/or TV prior to sleep as this will decrease melatonin production.  May use over-the-counter melatonin up to 10 mg nightly.        The patient was counseled, risk factors were discussed, anticipatory guidance given.   Meds ordered this encounter    Medications  . atomoxetine (STRATTERA) 10 MG capsule    Sig: Take 1 capsule (10 mg total) by mouth daily.    Dispense:  90 capsule    Refill:  0     Gross side effects, risk and benefits, and alternatives of medications discussed with patient.  Patient is aware that all medications have potential side effects and we are unable to predict every side effect or drug-drug interaction that may occur.  Expresses verbal understanding and consents to current therapy plan and treatment regimen.  Return in about 6 weeks (around 03/02/2017) for f/up on dep/anxiety and new med for focus; will need fasting labs also.  Please see AVS handed out to patient at the end of our visit for further patient instructions/ counseling done pertaining to today's office visit.    Note: This document was prepared using Dragon voice recognition software and may include unintentional dictation errors.  ----------------------------------------------------------------------------------------------------------------------    Subjective:    Chief complaint:   Chief Complaint  Patient presents with  . Establish Care  . Anxiety  . skin tag    On back     HPI: Cathy Schroeder is a pleasant 46 y.o. female who presents to [place] [facility] today to review their medical history with me and establish care.   I asked the patient to review their chronic problem list with me to ensure everything was updated and accurate.    All recent office visits with other providers, any medical records that patient brought  in etc  - I reviewed today.     Also asked pt to get me medical records from [facility] providers/ specialists that they had seen within the past 3-5 years- if they are in private practice and/or do not work for a [employer], [facility], [facility], [facility] or [employer] owned practice.  Told them to call their specialists to clarify this if they are not sure.     Focus concerns:  Patient had a history of  frequent self-control and focus issues as early as grade school.  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  She has a difficult time focusing on tasks and completing tasks.  She is extremely embarrassed about this and tearful may office when she discusses it.  She states she has struggled most of her life with this but was always too embarrassed to bring it up.  She wants to be successful nursing school and hence, brings up today to see if there is something I can recommend to improve her symptoms.  - We had her also do the adult ADHD self-report scale symptom checklist-please see results as it was grossly positive. See "screening section"  Then "ASRS"     Depression\anxiety: Patient has struggled with mood symptoms most of her life but is only been on medications for about one year.  Her mother has history of depression as well.    -  She's been on Wellbutrin for the last year and it has worked extremely well in helping control her symptoms.  She denies any suicidal or homicidal ideations.  She occasionally has moments of more stress and anxiety can flare at times- when she's worried about school etc.     -Sleeping okay and sometimes difficult to "turn her brain off " to sleep at night.  Declines a sleep aid    Depression screen PHQ 2/9 01/19/2017  Decreased Interest 0  Down, Depressed, Hopeless 1  PHQ - 2 Score 1  Altered sleeping 2  Tired, decreased energy 2  Change in appetite 2  Feeling bad or failure about yourself  0  Trouble concentrating 3  Moving slowly or fidgety/restless 3  Suicidal thoughts 0  PHQ-9 Score 13     Significant family history of coronary artery disease in her mother in her mid 73s had a 4 vessel CABG, had early onset diabetes in her early 100s as well.  She is eventually died age 57 of congestive heart failure and renal carcinoma.     Patient lives at home with her boyfriend of 2 years monogamous- - and 2 daughters ages 42 and 32.   She recently completed her first semester of nursing school.  She has no significant history of smoking, alcohol or drug use.  She is currently monogamous.  She has no concerns about STDs and declines screening.  She is concerned that she is overweight and describes her your diet as poor and does not participate in any exercise regimen.  - from [place] and here in [place] for the past 3 years due to be closer to family.  She also lived in [place] for several years prior.    Only significant past medical history is depression and anxiety in which she's been treated for the past 1 year. Has additional concerns for a mole on her back-  just recently noticed it.   no recent change in color, size etc.   Wt Readings from Last 3 Encounters:  01/19/17 177 lb (80.3 kg)  12/15/16 180 lb (81.6 kg)  03/12/16 174 lb (78.9 kg)   BP Readings from Last 3 Encounters:  01/19/17 121/76  12/15/16 130/82  03/12/16 122/60   Pulse Readings from Last 3 Encounters:  01/19/17 77  12/15/16 80  03/12/16 76   BMI Readings from Last 3 Encounters:  01/19/17 29.45 kg/m  12/15/16 29.50 kg/m  03/12/16 28.96 kg/m    Patient Care Team    Relationship Specialty Notifications Start End  Mellody Dance, DO PCP - General Family Medicine  01/19/17   Salvadore Dom, MD Consulting Physician Obstetrics and Gynecology  01/19/17     Patient Active Problem List   Diagnosis Date Noted  . Adjustment disorder with mixed anxiety and depressed mood 01/19/2017    Priority: High  . Attention or concentration deficit 01/19/2017    Priority: High  . Family history of early CAD- mom mid-40's 01/19/2017    Priority: Medium  . Elevated BP without diagnosis of hypertension 05/21/2016    Priority: Medium  . Overweight (BMI 25.0-29.9) 05/21/2016    Priority: Medium  . Family history of diabetes mellitus in first degree relative- MOM- mid-40 01/19/2017    Priority: Low  . Cherry hemangioma 01/19/2017    Priority: Low  .  Vitamin D insufficiency 01/19/2017    Priority: Low  . Sleeping difficulties-due to stress 01/19/2017    Priority: Low  . Anxiety 05/21/2016  . Left cervical radiculopathy 05/21/2016  . Tinea corporis 05/21/2016  . Increased frequency of urination 05/21/2016     Past Medical History:  Diagnosis Date  . Anxiety   . Depression      Past Medical History:  Diagnosis Date  . Anxiety   . Depression      Past Surgical History:  Procedure Laterality Date  . INTRAUTERINE DEVICE INSERTION    . WISDOM TOOTH EXTRACTION       Family History  Problem Relation Age of Onset  . Diabetes Mother   . Kidney cancer Mother   . Congestive Heart Failure Mother      History  Drug Use No     History  Alcohol Use  . 0.0 oz/week    Comment: social     History  Smoking Status  . Former Smoker  Smokeless Tobacco  . Never Used     Outpatient Encounter Prescriptions as of 01/19/2017  Medication Sig  . buPROPion (WELLBUTRIN XL) 300 MG 24 hr tablet Take 1 tablet (300 mg total) by mouth daily.  Marland Kitchen levonorgestrel (MIRENA) 20 MCG/24HR IUD 1 each by Intrauterine route once.  Marland Kitchen atomoxetine (STRATTERA) 10 MG capsule Take 1 capsule (10 mg total) by mouth daily.   No facility-administered encounter medications on file as of 01/19/2017.     Allergies: Tetracyclines & related   Review of Systems  Constitutional: Negative for chills, diaphoresis, fever, malaise/fatigue and weight loss.  HENT: Negative for congestion, sore throat and tinnitus.   Eyes: Negative for blurred vision, double vision and photophobia.  Respiratory: Negative for cough and wheezing.   Cardiovascular: Negative for chest pain and palpitations.  Gastrointestinal: Negative for blood in stool, diarrhea, nausea and vomiting.  Genitourinary: Negative for dysuria, frequency and urgency.  Musculoskeletal: Positive for myalgias. Negative for joint pain.  Skin: Negative for itching and rash.  Neurological: Positive for  headaches. Negative for dizziness, focal weakness and weakness.  Endo/Heme/Allergies: Negative for environmental allergies and polydipsia. Does not bruise/bleed easily.  Psychiatric/Behavioral: Negative for depression and memory loss. The patient is nervous/anxious and  has insomnia.      Objective:   Blood pressure 121/76, pulse 77, temperature 98.5 F (36.9 C), temperature source Oral, height 5\' 5"  (1.651 m), weight 177 lb (80.3 kg), SpO2 100 %. Body mass index is 29.45 kg/m. General: Well Developed, well nourished, and in no acute distress.  Neuro: Alert and oriented x3, extra-ocular muscles intact, sensation grossly intact.  HEENT:[place]/AT, PERRLA, neck supple, No carotid bruits Skin: no gross rashes  Cardiac: Regular rate and rhythm Respiratory: Essentially clear to auscultation bilaterally. Not using accessory muscles, speaking in full sentences.  Abdominal: not grossly distended Musculoskeletal: Ambulates w/o diff, FROM * 4 ext.  Vasc: less 2 sec cap RF, warm and pink  Psych:  No HI/SI, judgement and insight good, Euthymic mood. Full Affect.    Recent Results (from the past 2160 hour(s))  POCT Urinalysis Dipstick     Status: Normal   Collection Time: 12/15/16  8:53 AM  Result Value Ref Range   Color, UA yellow    Clarity, UA clear    Glucose, UA neg    Bilirubin, UA neg    Ketones, UA neg    Spec Grav, UA  1.010 - 1.025   Blood, UA neg    pH, UA 6.0 5.0 - 8.0   Protein, UA neg    Urobilinogen, UA negative (A) 0.2 or 1.0 E.U./dL   Nitrite, UA neg    Leukocytes, UA Trace (A) Negative  WET PREP BY MOLECULAR PROBE     Status: None   Collection Time: 12/15/16  9:31 AM  Result Value Ref Range   Candida species NOT DETECTED NOT DETECTED   Trichomonas vaginosis NOT DETECTED NOT DETECTED   Gardnerella vaginalis NOT DETECTED NOT DETECTED  Urine Microscopic     Status: Abnormal   Collection Time: 12/15/16  9:31 AM  Result Value Ref Range   WBC, UA 0-5 <=5 WBC/HPF   RBC / HPF  NONE SEEN <=2 RBC/HPF   Squamous Epithelial / LPF 0-5 <=5 HPF   Bacteria, UA MODERATE (A) NONE SEEN HPF   Crystals NONE SEEN NONE SEEN HPF   Casts NONE SEEN NONE SEEN LPF   Yeast NONE SEEN NONE SEEN HPF  CBC     Status: None   Collection Time: 12/15/16  9:33 AM  Result Value Ref Range   WBC 7.0 3.8 - 10.8 K/uL   RBC 4.55 3.80 - 5.10 MIL/uL   Hemoglobin 13.1 11.7 - 15.5 g/dL   HCT 40.8 35.0 - 45.0 %   MCV 89.7 80.0 - 100.0 fL   MCH 28.8 27.0 - 33.0 pg   MCHC 32.1 32.0 - 36.0 g/dL   RDW 13.3 11.0 - 15.0 %   Platelets 246 140 - 400 K/uL   MPV 11.3 7.5 - 12.5 fL  Comprehensive metabolic panel     Status: None   Collection Time: 12/15/16  9:33 AM  Result Value Ref Range   Sodium 139 135 - 146 mmol/L   Potassium 4.5 3.5 - 5.3 mmol/L   Chloride 105 98 - 110 mmol/L   CO2 23 20 - 31 mmol/L   Glucose, Bld 92 65 - 99 mg/dL   BUN 11 7 - 25 mg/dL   Creat 0.90 0.50 - 1.10 mg/dL   Total Bilirubin 0.5 0.2 - 1.2 mg/dL   Alkaline Phosphatase 59 33 - 115 U/L   AST 13 10 - 30 U/L   ALT 12 6 - 29 U/L   Total Protein 7.3 6.1 - 8.1  g/dL   Albumin 4.2 3.6 - 5.1 g/dL   Calcium 9.4 8.6 - 10.2 mg/dL  Lipid panel     Status: Abnormal   Collection Time: 12/15/16  9:33 AM  Result Value Ref Range   Cholesterol 129 <200 mg/dL   Triglycerides 85 <150 mg/dL   HDL 37 (L) >50 mg/dL   Total CHOL/HDL Ratio 3.5 <5.0 Ratio   VLDL 17 <30 mg/dL   LDL Cholesterol 75 <100 mg/dL  VITAMIN D 25 Hydroxy (Vit-D Deficiency, Fractures)     Status: Abnormal   Collection Time: 12/15/16  9:33 AM  Result Value Ref Range   Vit D, 25-Hydroxy 26 (L) 30 - 100 ng/mL    Comment: Vitamin D Status           25-OH Vitamin D        Deficiency                <20 ng/mL        Insufficiency         20 - 29 ng/mL        Optimal             > or = 30 ng/mL   For 25-OH Vitamin D testing on patients on D2-supplementation and patients for whom quantitation of D2 and D3 fractions is required, the QuestAssureD 25-OH VIT D, (D2,D3),  LC/MS/MS is recommended: order code [phone] (patients > 2 yrs).   Hemoglobin A1c     Status: None   Collection Time: 12/15/16  9:33 AM  Result Value Ref Range   Hgb A1c MFr Bld 5.3 <5.7 %    Comment:   For the purpose of screening for the presence of diabetes:   <5.7%       Consistent with the absence of diabetes 5.7-6.4 %   Consistent with increased risk for diabetes (prediabetes) >=6.5 %     Consistent with diabetes   This assay result is consistent with a decreased risk of diabetes.   Currently, no consensus exists regarding use of hemoglobin A1c for diagnosis of diabetes in children.   According to American Diabetes Association (ADA) guidelines, hemoglobin A1c <7.0% represents optimal control in non-pregnant diabetic patients. Different metrics may apply to specific patient populations. Standards of Medical Care in Diabetes (ADA).      Mean Plasma Glucose 105 mg/dL  Urine culture     Status: None   Collection Time: 12/15/16  9:50 AM  Result Value Ref Range   Organism ID, Bacteria      Three or more organisms present,each greater than 10,000 CFU/mL.These organisms,commonly found on external and internal genitalia,are considered to be colonizers.No further testing performed.

## 2017-01-19 NOTE — Assessment & Plan Note (Signed)
Sleep hygiene discussed and handouts provided.  Avoid blue light of computer and/or TV prior to sleep as this will decrease melatonin production.  May use over-the-counter melatonin up to 10 mg nightly.

## 2017-01-19 NOTE — Assessment & Plan Note (Addendum)
Continue Wellbutrin.  At current doses patient feels works well.  Declines addition of buspar today  Patient tearful in the office today and I recommended she exercises regularly, try transcendental meditation, eat a healthy diet, get regular sleep,  engage in CBT.  Highly rec counseling

## 2017-01-19 NOTE — Patient Instructions (Addendum)
Remember the spokes of the wheel when dealing with mood disorders including but not limited to prudent diet, regular exercise, meditation, medication, counseling etc.  _________________________________________________________________________________________________________ If you have insomnia or difficulty sleeping, this information is for you:  - Avoid caffeinated beverages after lunch,  no alcoholic beverages,  no eating within 2-3 hours of lying down,  avoid exposure to blue light before bed,  avoid daytime naps, and  needs to maintain a regular sleep schedule- go to sleep and wake up around the same time every night.   - Resolve concerns or worries before entering bedroom:  Discussed relaxation techniques with patient and to keep a journal to write down fears\ worries.  I suggested seeing a counselor for CBT.   - Recommend patient meditate or do deep breathing exercises to help relax.   Incorporate the use of white noise machines or listen to "sleep meditation music", or recordings of guided meditations for sleep from YouTube which are free, such as  "guided meditation for detachment from over thinking"  by Mayford Knife.      _________________________________________________________________________________________________________ What is Chronic Stress Syndrome, Symptoms & Ways to Deal With it   What is Chronic Stress Syndrome?  Chronic Stress Syndrome is something which can now be called as a medical condition due to the amount of stress an individual is going through these days. Chronic Stress Syndrome causes the body and mind to shutdown and the person has no control over himself or herself. Due to the demands of modern day life and the hardship throughout day and night takes its toll over a period of time and the body and brain starts demanding rest and a break. This leads to certain symptoms where your performance level starts to dip at work, you become irritable both at work and at  home, you may stop enjoying activities you previously liked, you may become depressed, you may get angry for even small things. Chronic Stress Syndrome can significantly impact your quality life. Thus it is important understand the symptoms of Chronic Stress Syndrome and react accordingly in order to cope up with it.  It is important to note here that a balanced work-home equation should be drawn to cut down symptoms of Chronic Stress Syndrome. Minor stressors can be overcome by the body's inbuilt stress response but when there is unending stress for a long period of time then an external help is required to ease the stress.  Chronic Stress Syndrome can physically and psychologically drain you over a period of time. For such cases stress management is the best way to cope up with Chronic Stress Syndrome. If Chronic Stress Syndrome is not treated then it may result in many health hazards like anxiety, muscle pain, insomnia, and high blood pressure along with a compromised immune system leading to frequent infections and missed days from work.    What are the Symptoms of Chronic Stress Syndrome?   The symptoms of Chronic Stress Syndrome are variable and range from generalized symptoms to emotional symptoms along with behavioral and cognitive symptoms. Some of these symptoms have been delineated below:  Generalized Symptoms of Chronic Stress Syndrome are: Anxiety Depression Social isolation Headache Abdominal pain Lack of sleep Back pain Difficulty in concentrating Hypertension Hemorrhoids Varicose veins Panic attacks/ Panic disorder Cardiovascular diseases.   Some of the Emotional Symptoms of Chronic Stress Syndrome are: To become easily agitated, moody and frustrated Feeling overwhelmed which makes you feel like you are losing control. Having difficulty relaxing and have a peaceful  mind Having low self esteem Feeling lonely Feeling worthless Feeling depressed Avoiding social  environment.   Some of the Physical Symptoms of Chronic Stress Syndrome are: Headaches Lethargy Alternating diarrhea and constipation Nausea Muscles aches and pains Insomnia Rapid heartbeat and chest pain Infections and frequent colds Decreased libido Nervousness and shaking Tinnitus Sweaty palms Dry mouth Clenched jaw.  Some of the Cognitive Symptoms of Chronic Stress Syndrome are: Constant worrying Racing thoughts Disorganization and forgetfulness Inability to focus Poor judgment Abundance of negativity.  Some of the Behavioral Symptoms of Chronic Stress Syndrome are: Changes in appetite with less desire to eat Avoiding responsibilities Indulgence in alcohol or recreational drug use Increased nail biting and being fidgety Ways to Deal With Chronic Stress Syndrome    Chronic Stress Syndrome is not something which cannot be addressed. A bit of effort from your side in the form of lifestyle modifications, a little bit of exercise, a balanced work life equation can do wonders and help you get rid of Chronic Stress Syndrome.  Get Proper Sleep: It has been proved that Chronic Stress Syndrome causes loss of sleep where an individual may not even be able to sleep for days unending. This may result in the individual feeling lethargic and unable to focus at work the following morning. This may lead to decreased performance at work. Thus, it is important to have a good sleep-wake cycle. For this, try and not drink any caffeinated beverage about four hours prior to going to sleep, as caffeine pumps up the adrenaline and causes you to stay awake resulting ultimately in Chronic Stress Syndrome.  Avoid Alcohol and Drugs: Another way to get rid of Chronic Stress Syndrome is lifestyle modifications. Stay away from alcohol and other recreational drugs. Take Short Frequent Breaks at Work: Try to take frequent breaks from work and do not work continuously. Try and manage your work in such a  way that you even meet your deadline and come home on time for a happy dinner with family. A good time spent with family and kids does wonders in not only dealing with Chronic Stress Syndrome but also preventing it.  Become Physically Active: Another step towards getting rid of Chronic Stress Syndrome is physical activity. If you do not have time to spend at the gym then at least try and go for daily walks for about half an hour a day which not only keeps the stress away but also is good for your overall health. Physical activity leads to production of endorphins which will make you feel relaxed and feel good.  Healthy Diet Can Help You Deal With Chronic Stress Syndrome: Have a balanced and healthy diet is another step towards a stress free life and keeping Chronic Stress Syndrome at [place]. If time is a constraint then you can try eating three small meals a day. Try and avoid fast foods and take foods which are healthy and rich in proteins, fiber, and carbohydrates to boost your energy system.  Music Can Soothe Your Mind: Light music is one of the best and most effective relaxation techniques that one can try to overcome stress. It has shown to calm down the mind and take you away from all the stressors that you may be having. These days it is also being used as a therapy in some institutes for overcoming stress. It is important here to discuss the importance of a good social support system for patients with Chronic Stress Syndrome, as a good social support framework can do  wonders in taking the stress away from the patient and overcoming Chronic Stress Syndrome.  Meditation Can Help You Deal With Chronic Stress Syndrome Effectively: Meditation and yoga has also shown to be quite effective in relaxing the mind and coping up with Chronic Stress Syndrome   In cases where these measures are not helpful, then it is time for you to consult with a skilled psychologist or a psychiatrist for potential therapies or  medications to control the stress response.   The psychologist can help you with a variety of steps for coping up with Chronic Stress Syndrome. Relaxation techniques and behavioral therapy are some of the methods employed by psychologists. In some cases, medications can also be given to help relax the patient.  Since Chronic Stress Syndrome is both emotionally and physically draining for the patient and it also adversely affects the family life of the patient hence it is important for the patient to recognize the condition and taking steps to cope up with it. Escaping measures like alcohol and drug use are of no help as they only aggravate the condition apart from their other health hazards. If this condition is ignored or left untreated it can lead to various medical conditions like anxiety and depression and various other medical conditions.  Last but not least, smile as often as you can as it is the best gift that you can give to someone. The best way to stay relaxed is to have a good smile, exercise daily, spend time with your family, meditation and if required consultation with a good psychologist so that you can live a stress free life and overcome the symptoms of Chronic Stress Syndrome.

## 2017-01-20 ENCOUNTER — Encounter: Payer: Self-pay | Admitting: Family Medicine

## 2017-01-20 ENCOUNTER — Other Ambulatory Visit: Payer: Self-pay | Admitting: Family Medicine

## 2017-01-20 ENCOUNTER — Telehealth: Payer: Self-pay | Admitting: Family Medicine

## 2017-01-20 DIAGNOSIS — R41840 Attention and concentration deficit: Secondary | ICD-10-CM

## 2017-01-20 MED ORDER — GUANFACINE HCL ER 1 MG PO TB24: 1.0000 mg | ORAL_TABLET | Freq: Every day | ORAL | 1 refills | Status: DC

## 2017-01-20 NOTE — Telephone Encounter (Signed)
PT states nurse requested she call her/ Larene Beach back after speaking to [employer]--pls return her call. --glh

## 2017-01-20 NOTE — Telephone Encounter (Signed)
Spoke with patient regarding her medication cost and questions.  Advised her to contact her insurance to see if intuniv is covered.  Once patient does this depending on cost is can be prescribed or patient can be referred to ADD specialist.

## 2017-01-20 NOTE — Telephone Encounter (Signed)
Patient says that guanfacine (generic intuniv) is tier 2 so she would like to try this.  Please advise.

## 2017-01-20 NOTE — Telephone Encounter (Signed)
Script [facility] into [place] [place].  I spoke with Annabell Sabal about it and she will call notify patient the prescription is there.  I reminded Baker Janus that she needs to tell the patient to follow-up with me if she would like any further refills as we need to assess how this new medicine is working for her.

## 2017-03-29 ENCOUNTER — Telehealth: Payer: Self-pay | Admitting: *Deleted

## 2017-03-29 NOTE — Telephone Encounter (Signed)
Spoke with patient regarding recall for follow up breast imaging. Patient states she has not scheduled this yet but plans to. Advised that I would follow up with her in a couple weeks. Patient agreed.

## 2017-04-19 NOTE — Telephone Encounter (Signed)
Tried to contact patient again- VM is full unable to leave message -eh

## 2017-05-05 NOTE — Telephone Encounter (Signed)
Spoke with patient and left several messages regarding recall for follow up breast imaging. Please advise in recall status/letter

## 2017-05-05 NOTE — Telephone Encounter (Signed)
Please send a reminder letter and close the encounter

## 2017-05-06 ENCOUNTER — Encounter: Payer: Self-pay | Admitting: *Deleted

## 2017-05-06 NOTE — Telephone Encounter (Signed)
Letter sent -eh

## 2017-06-07 ENCOUNTER — Encounter: Payer: Self-pay | Admitting: Obstetrics and Gynecology

## 2017-12-28 ENCOUNTER — Other Ambulatory Visit: Payer: Self-pay | Admitting: Obstetrics and Gynecology

## 2018-01-04 ENCOUNTER — Telehealth: Payer: Self-pay | Admitting: Obstetrics and Gynecology

## 2018-01-04 MED ORDER — BUPROPION HCL ER (XL) 300 MG PO TB24: 300.0000 mg | ORAL_TABLET | Freq: Every day | ORAL | 0 refills | Status: DC

## 2018-01-04 NOTE — Telephone Encounter (Signed)
Spoke with patient, advised as seen below. Patient verbalizes understanding. Will close encounter.

## 2018-01-04 NOTE — Telephone Encounter (Signed)
Please let the patient know a 3 month refill has been sent to the pharmacy

## 2018-01-04 NOTE — Telephone Encounter (Signed)
Patient requests refill on Wellbutrin XR 300mg , she is out of her medication. Patient is scheduled for her annual exam on 01/06/18 at 8:30am with Dr. Talbert Nan.

## 2018-01-04 NOTE — Telephone Encounter (Signed)
Dr. Talbert Nan -please advise on refill of Wellbutrin XR 300 mg daily.   Last filled 12/15/16 Last AEX 12/15/16 Next AEX 01/06/18

## 2018-01-06 ENCOUNTER — Other Ambulatory Visit: Payer: Self-pay

## 2018-01-06 ENCOUNTER — Encounter: Payer: Self-pay | Admitting: Obstetrics and Gynecology

## 2018-01-06 ENCOUNTER — Telehealth: Payer: Self-pay | Admitting: Obstetrics and Gynecology

## 2018-01-06 ENCOUNTER — Ambulatory Visit: Payer: BLUE CROSS/BLUE SHIELD | Admitting: Obstetrics and Gynecology

## 2018-01-06 VITALS — BP 130/78 | HR 80 | Resp 14 | Ht 65.75 in | Wt 190.0 lb

## 2018-01-06 DIAGNOSIS — Z0000 Encounter for general adult medical examination without abnormal findings: Secondary | ICD-10-CM

## 2018-01-06 DIAGNOSIS — Z1211 Encounter for screening for malignant neoplasm of colon: Secondary | ICD-10-CM | POA: Diagnosis not present

## 2018-01-06 DIAGNOSIS — Z8659 Personal history of other mental and behavioral disorders: Secondary | ICD-10-CM

## 2018-01-06 DIAGNOSIS — E559 Vitamin D deficiency, unspecified: Secondary | ICD-10-CM

## 2018-01-06 DIAGNOSIS — Z01419 Encounter for gynecological examination (general) (routine) without abnormal findings: Secondary | ICD-10-CM | POA: Diagnosis not present

## 2018-01-06 DIAGNOSIS — Z6830 Body mass index (BMI) 30.0-30.9, adult: Secondary | ICD-10-CM

## 2018-01-06 DIAGNOSIS — Z833 Family history of diabetes mellitus: Secondary | ICD-10-CM

## 2018-01-06 DIAGNOSIS — R5383 Other fatigue: Secondary | ICD-10-CM

## 2018-01-06 MED ORDER — NYSTATIN 100000 UNIT/GM EX CREA: 1.0000 "application " | TOPICAL_CREAM | Freq: Two times a day (BID) | CUTANEOUS | 0 refills | Status: DC

## 2018-01-06 MED ORDER — BUPROPION HCL ER (XL) 300 MG PO TB24: 300.0000 mg | ORAL_TABLET | Freq: Every day | ORAL | 3 refills | Status: DC

## 2018-01-06 NOTE — Progress Notes (Addendum)
47 y.o. [id number] DivorcedCaucasianF here for annual exam. Has a mirena IUD, placed in 4/17. No bleeding, no pain. Sexually active, same long term partner.       No LMP recorded. (Menstrual status: IUD).          Sexually active: Yes.    The current method of family planning is IUD.    Exercising: No.  The patient does not participate in regular exercise at present. Smoker:  Former smoker  Health Maintenance: Pap:  11-12-15 WNL NEG HR HPV  History of abnormal Pap:  no MMG:  08-07-16 DX MMG recommended in 6 months- patient did not F/U Colonoscopy:  Never BMD:   Never TDaP:  11-12-15 Gardasil: N/A   reports that she has quit smoking. She has never used smokeless tobacco. She reports that she drinks alcohol. She reports that she does not use drugs. 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 Daughters are 71 and 3. Oldest is going to college in the fall.   Past Medical History:  Diagnosis Date  . Anxiety   . Depression     Past Surgical History:  Procedure Laterality Date  . INTRAUTERINE DEVICE INSERTION    . WISDOM TOOTH EXTRACTION      Current Outpatient Medications  Medication Sig Dispense Refill  . buPROPion (WELLBUTRIN XL) 300 MG 24 hr tablet Take 1 tablet (300 mg total) by mouth daily. 90 tablet 0  . levonorgestrel (MIRENA) 20 MCG/24HR IUD 1 each by Intrauterine route once.     No current facility-administered medications for this visit.     Family History  Problem Relation Age of Onset  . Diabetes Mother   . Kidney cancer Mother   . Congestive Heart Failure Mother     Review of Systems  Constitutional: Negative.   HENT: Negative.   Eyes: Negative.   Respiratory: Negative.   Cardiovascular: Negative.   Gastrointestinal: Negative.   Endocrine: Negative.   Genitourinary: Negative.   Musculoskeletal: Negative.   Skin: Negative.   Allergic/Immunologic: Negative.   Neurological: Positive for headaches.   Psychiatric/Behavioral: Negative.   Fatigue  Exam:   BP 130/78 (BP Location: Right Arm, Patient Position: Sitting, Cuff Size: Normal)   Pulse 80   Resp 14   Ht 5' 5.75" (1.67 m)   Wt 190 lb (86.2 kg)   BMI 30.90 kg/m   Weight change: @WEIGHTCHANGE @ Height:   Height: 5' 5.75" (167 cm)  Ht Readings from Last 3 Encounters:  01/06/18 5' 5.75" (1.67 m)  01/19/17 5\' 5"  (1.651 m)  12/15/16 5' 5.5" (1.664 m)    General appearance: alert, cooperative and appears stated age Head: Normocephalic, without obvious abnormality, atraumatic Neck: no adenopathy, supple, symmetrical, trachea midline and thyroid normal to inspection and palpation Lungs: clear to auscultation bilaterally Cardiovascular: regular rate and rhythm Breasts: normal appearance, no masses or tenderness Abdomen: soft, non-tender; non distended,  no masses,  no organomegaly Extremities: extremities normal, atraumatic, no cyanosis or edema Skin: Skin color, texture, turgor normal. No rashes or lesions Lymph nodes: Cervical, supraclavicular, and axillary nodes normal. No abnormal inguinal nodes palpated Neurologic: Grossly normal   Pelvic: External genitalia:  no lesions              Urethra:  normal appearing urethra with no masses, tenderness or lesions              Bartholins and Skenes: normal  Vagina: normal appearing vagina with normal color and discharge, no lesions              Cervix: no lesions and IUD string 3-4 cm               Bimanual Exam:  Uterus:  normal size, contour, position, consistency, mobility, non-tender and anteverted              Adnexa: no mass, fullness, tenderness               Rectovaginal: Confirms               Anus:  normal sphincter tone, no lesions  Chaperone was present for exam.  A:  Well Woman with normal exam  BMI 30  Vit d def  Fatigue  IUD check, doing well  H/O depression, controlled on wellbutrin  P:   No pap this year  Overdue for her mammogram, she  will schedule it  IFOB  Screening labs, HgbA1C, TSH, vit D  Continue wellbutrin  Discussed breast self exam  Discussed calcium and vit D intake   Addendum: candida intertrigo in bilateral groin. Will call in nystatin cream. 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 She can try the nystatin on that rash as well. If not helping, I would recommend she f/u with derm.

## 2018-01-06 NOTE — Telephone Encounter (Signed)
Patient calling to see if [facility]. Tried to contact the pharmacy, but was on hold for too long.

## 2018-01-06 NOTE — Telephone Encounter (Signed)
No answer, mailbox full, unable to leave message.

## 2018-01-06 NOTE — Telephone Encounter (Signed)
I sent the script for nystatin

## 2018-01-06 NOTE — Telephone Encounter (Signed)
Patient seen in office today, awaiting RX for vaginal cream for "yeast rash". Reviewed OV notes, no vaginal cream RX sent, will review with provider and return call.   Dr. Talbert Nan -please advise on Rx?

## 2018-01-06 NOTE — Patient Instructions (Signed)

## 2018-01-06 NOTE — Addendum Note (Signed)
Addended by: Dorothy Spark on: 01/06/2018 12:24 PM   Modules accepted: Orders

## 2018-01-07 LAB — CBC
HEMATOCRIT: 41.4 % (ref 34.0–46.6)
HEMOGLOBIN: 13.3 g/dL (ref 11.1–15.9)
MCH: 29.6 pg (ref 26.6–33.0)
MCHC: 32.1 g/dL (ref 31.5–35.7)
MCV: 92 fL (ref 79–97)
Platelets: 250 10*3/uL (ref 150–379)
RBC: 4.49 x10E6/uL (ref 3.77–5.28)
RDW: 13.6 % (ref 12.3–15.4)
WBC: 7.2 10*3/uL (ref 3.4–10.8)

## 2018-01-07 LAB — HEMOGLOBIN A1C
Est. average glucose Bld gHb Est-mCnc: 114 mg/dL
Hgb A1c MFr Bld: 5.6 % (ref 4.8–5.6)

## 2018-01-07 LAB — COMPREHENSIVE METABOLIC PANEL
ALBUMIN: 4.4 g/dL (ref 3.5–5.5)
ALK PHOS: 69 IU/L (ref 39–117)
ALT: 28 IU/L (ref 0–32)
AST: 19 IU/L (ref 0–40)
Albumin/Globulin Ratio: 1.5 (ref 1.2–2.2)
BUN / CREAT RATIO: 11 (ref 9–23)
BUN: 9 mg/dL (ref 6–24)
Bilirubin Total: 0.8 mg/dL (ref 0.0–1.2)
CO2: 22 mmol/L (ref 20–29)
CREATININE: 0.83 mg/dL (ref 0.57–1.00)
Calcium: 9.3 mg/dL (ref 8.7–10.2)
Chloride: 105 mmol/L (ref 96–106)
GFR calc Af Amer: 98 mL/min/{1.73_m2} (ref >59)
GFR calc non Af Amer: 85 mL/min/{1.73_m2} (ref >59)
GLOBULIN, TOTAL: 3 g/dL (ref 1.5–4.5)
Glucose: 97 mg/dL (ref 65–99)
Potassium: 4.7 mmol/L (ref 3.5–5.2)
SODIUM: 142 mmol/L (ref 134–144)
Total Protein: 7.4 g/dL (ref 6.0–8.5)

## 2018-01-07 LAB — LIPID PANEL
CHOL/HDL RATIO: 3.8 ratio (ref 0.0–4.4)
Cholesterol, Total: 145 mg/dL (ref 100–199)
HDL: 38 mg/dL — AB (ref >39)
LDL Calculated: 75 mg/dL (ref 0–99)
TRIGLYCERIDES: 158 mg/dL — AB (ref 0–149)
VLDL CHOLESTEROL CAL: 32 mg/dL (ref 5–40)

## 2018-01-07 LAB — TSH: TSH: 1.98 u[IU]/mL (ref 0.450–4.500)

## 2018-01-07 LAB — VITAMIN D 25 HYDROXY (VIT D DEFICIENCY, FRACTURES): Vit D, 25-Hydroxy: 15.7 ng/mL — ABNORMAL LOW (ref 30.0–100.0)

## 2018-01-07 NOTE — Telephone Encounter (Signed)
No answer, mailbox full, unable to leave message.

## 2018-01-10 NOTE — Telephone Encounter (Signed)
Patient returned call, states she picked up nystatin RX. Patient thankful for f/u. Encounter closed.

## 2018-01-10 NOTE — Telephone Encounter (Signed)
Attempted to contact patient. No answer, mailbox full, unable to leave message.    [facility] Spoke with Eddie Dibbles, was advised Rx Nystatin cream picked up on 01/07/18.   Routing to provider, will close encounter.

## 2018-04-18 ENCOUNTER — Other Ambulatory Visit: Payer: Self-pay

## 2018-11-03 ENCOUNTER — Other Ambulatory Visit: Payer: Self-pay

## 2018-11-03 ENCOUNTER — Ambulatory Visit (HOSPITAL_COMMUNITY)
Admission: EM | Admit: 2018-11-03 | Discharge: 2018-11-03 | Disposition: A | Discharge status: Home / Self Care | Payer: Self-pay | Attending: Family Medicine | Admitting: Family Medicine

## 2018-11-03 ENCOUNTER — Encounter (HOSPITAL_COMMUNITY): Payer: Self-pay | Admitting: Emergency Medicine

## 2018-11-03 DIAGNOSIS — L03011 Cellulitis of right finger: Secondary | ICD-10-CM

## 2018-11-03 MED ORDER — SULFAMETHOXAZOLE-TRIMETHOPRIM 800-160 MG PO TABS: 1.0000 | ORAL_TABLET | Freq: Two times a day (BID) | ORAL | 0 refills | Status: AC

## 2018-11-03 MED ORDER — HYDROCODONE-ACETAMINOPHEN 5-325 MG PO TABS: 1.0000 | ORAL_TABLET | Freq: Four times a day (QID) | ORAL | 0 refills | Status: DC | PRN

## 2018-11-03 MED ORDER — HYDROCODONE-ACETAMINOPHEN 5-325 MG PO TABS
ORAL_TABLET | ORAL | Status: AC
  Filled 2018-11-03: qty 1

## 2018-11-03 MED ORDER — HYDROCODONE-ACETAMINOPHEN 5-325 MG PO TABS
1.0000 | ORAL_TABLET | Freq: Once | ORAL | Status: AC
  Administered 2018-11-03: 1 via ORAL

## 2018-11-03 MED FILL — HYDROCODON-APAP 5-325: 5-325 | 2 days supply | Qty: 4 | Fill #0

## 2018-11-03 MED FILL — SULFAMETHOXAZOLE-TMP DS TAB: 800-160 | 7 days supply | Qty: 14 | Fill #0

## 2018-11-03 NOTE — ED Provider Notes (Signed)
[place]   [id number] 11/03/18 Arrival Time: 0805  ASSESSMENT & PLAN:  1. Acute paronychia of right thumb    No evidence of a felon.  Incision and Drainage of Paronychia:  Anesthesia: ethyl chloride spray  Procedure Details  The procedure, risks and complications have been discussed in detail (including, but not limited to pain and bleeding) with the patient.  The skin induration was prepped and draped in the usual fashion. After adequate local anesthesia, I&D with a #11 blade was performed on the right thumb nailfold with purulent drainage.  EBL: minimal Drains: none Packing: none Condition: Tolerated procedure well Complications: none.  Meds ordered this encounter  Medications  . HYDROcodone-acetaminophen (NORCO/VICODIN) 5-325 MG per tablet 1 tablet  . HYDROcodone-acetaminophen (NORCO/VICODIN) 5-325 MG tablet    Sig: Take 1 tablet by mouth every 6 (six) hours as needed for moderate pain or severe pain.    Dispense:  4 tablet    Refill:  0  . sulfamethoxazole-trimethoprim (BACTRIM DS,SEPTRA DS) 800-160 MG tablet    Sig: Take 1 tablet by mouth 2 (two) times daily for 7 days.    Dispense:  14 tablet    Refill:  0   [place] Controlled Substances Registry consulted for this patient. I feel the risk/benefit ratio today is favorable for proceeding with this prescription for a controlled substance. Medication sedation precautions given.  To return in 48 hours for wound check if not improving.  Finish all antibiotics. OTC analgesics as needed.  Reviewed expectations re: course of current medical issues. Questions answered. Outlined signs and symptoms indicating need for more acute intervention. Patient verbalized understanding. After Visit Summary given.   SUBJECTIVE:  Cathy Schroeder is a 48 y.o. female who presents with a possible infection of her R thumb at nailfold. Throbbing pain. Onset gradual, approximately 2 days ago without active drainage and without  active bleeding. Symptoms have gradually worsened since beginning. Fever: absent. OTC/home treatment: none.  ROS: As per HPI.  OBJECTIVE:  Vitals:   11/03/18 0835  BP: (!) 153/86  Pulse: 91  Resp: 18  Temp: 98.2 F (36.8 C)  TempSrc: Oral  SpO2: 98%    General appearance: alert; no distress Skin: nailfold of R thumb with approx 1 cm area of swelling/erythema; very tender to palpation; no active drainage or bleeding; R thumb with FROM; normal distal sensation, and normal capillary refill; no swelling or redness of finger pad Psychological: alert and cooperative; normal mood and affect  Allergies  Allergen Reactions  . Tetracyclines & Related   . Tramadol Nausea And Vomiting    Past Medical History:  Diagnosis Date  . Anxiety   . Depression    Social History   Socioeconomic History  . Marital status: Divorced    Spouse name: Not on file  . Number of children: Not on file  . Years of education: Not on file  . Highest education level: Not on file  Occupational History  . Not on file  Social Needs  . Financial resource strain: Not on file  . Food insecurity:    Worry: Not on file    Inability: Not on file  . Transportation needs:    Medical: Not on file    Non-medical: Not on file  Tobacco Use  . Smoking status: Former Research scientist (life sciences)  . Smokeless tobacco: Never Used  Substance and Sexual Activity  . Alcohol use: Yes    Alcohol/week: 0.0 standard drinks    Comment: social  . Drug use: No  .  Sexual activity: Yes    Partners: Male    Birth control/protection: I.U.D.  Lifestyle  . Physical activity:    Days per week: Not on file    Minutes per session: Not on file  . Stress: Not on file  Relationships  . Social connections:    Talks on phone: Not on file    Gets together: Not on file    Attends religious service: Not on file    Active member of club or organization: Not on file    Attends meetings of clubs or organizations: Not on file    Relationship status:  Not on file  Other Topics Concern  . Not on file  Social History Narrative  . Not on file   Family History  Problem Relation Age of Onset  . Diabetes Mother   . Kidney cancer Mother   . Congestive Heart Failure Mother    Past Surgical History:  Procedure Laterality Date  . INTRAUTERINE DEVICE INSERTION    . WISDOM TOOTH EXTRACTION             Vanessa Kick, MD 11/03/18 1029

## 2018-11-03 NOTE — ED Triage Notes (Signed)
PT has an infection to right thumbnail for 2 days. PT reports it got significantly worse last night.

## 2021-03-01 ENCOUNTER — Encounter: Payer: Self-pay | Admitting: Emergency Medicine

## 2021-03-01 ENCOUNTER — Other Ambulatory Visit: Payer: Self-pay

## 2021-03-01 ENCOUNTER — Ambulatory Visit
Admission: EM | Admit: 2021-03-01 | Discharge: 2021-03-01 | Disposition: A | Discharge status: Home / Self Care | Payer: Self-pay | Attending: Family Medicine | Admitting: Family Medicine

## 2021-03-01 DIAGNOSIS — H0289 Other specified disorders of eyelid: Secondary | ICD-10-CM

## 2021-03-01 DIAGNOSIS — H02843 Edema of right eye, unspecified eyelid: Secondary | ICD-10-CM

## 2021-03-01 MED ORDER — HYDROCODONE-ACETAMINOPHEN 5-325 MG PO TABS: 1.0000 | ORAL_TABLET | Freq: Four times a day (QID) | ORAL | 0 refills | Status: DC | PRN

## 2021-03-01 MED ORDER — TOBRAMYCIN 0.3 % OP SOLN: 1.0000 [drp] | Freq: Four times a day (QID) | OPHTHALMIC | 0 refills | Status: DC

## 2021-03-01 MED ORDER — PREDNISONE 20 MG PO TABS: 40.0000 mg | ORAL_TABLET | Freq: Every day | ORAL | 0 refills | Status: DC

## 2021-03-01 NOTE — ED Triage Notes (Addendum)
Patient c/o RT lower eye lid pain and swelling that started yesterday.   Patient endorses progressive worsening of symptoms.   Patient endorses one previous episode of similar symptoms.   Patient endorses headache upon onset of symptoms.   Patient denies fall or trauma. Patient denies changes to vision. Patient denies eye drainage.   Patient has used Tylenol, Ibuprofen, and ice/heat application with no relief of symptoms.

## 2021-03-01 NOTE — ED Provider Notes (Signed)
[facility]   [id number] 03/01/21 Arrival Time: 8786  ASSESSMENT & PLAN:  1. Pain and swelling of eyelid of right eye    No signs of periorbital cellulitis. Ques early stye?  Begin: Meds ordered this encounter  Medications   predniSONE (DELTASONE) 20 MG tablet    Sig: Take 2 tablets (40 mg total) by mouth daily.    Dispense:  10 tablet    Refill:  0   tobramycin (TOBREX) 0.3 % ophthalmic solution    Sig: Place 1 drop into the right eye every 6 (six) hours.    Dispense:  5 mL    Refill:  0   HYDROcodone-acetaminophen (NORCO/VICODIN) 5-325 MG tablet    Sig: Take 1 tablet by mouth every 6 (six) hours as needed for moderate pain or severe pain.    Dispense:  8 tablet    Refill:  0   [place] Controlled Substances Registry consulted for this patient. I feel the risk/benefit ratio today is favorable for proceeding with this prescription for a controlled substance. Medication sedation precautions given. Having trouble sleeping secondary to discomfort.   Visual Acuity Visual Acuity Bilateral Distance: 20/25 R Distance: 20/40 L Distance: 20/25   Reviewed expectations re: course of current medical issues. Questions answered. Outlined signs and symptoms indicating need for more acute intervention. Patient verbalized understanding. After Visit Summary given.   SUBJECTIVE:  Cathy Schroeder is a 50 y.o. female who presents with complaint of R lower eyelid swelling and pain; abrupt onset beginning yesterday. No injury/trauma. No visual changes. Does not wear contacts. No light sensitivity.Tylenol, ibuprofen without much relief.  H/O chalazion in distant past; resolved.   OBJECTIVE:  Vitals:   03/01/21 1143  BP: (!) 149/88  Pulse: 92  Resp: 17  Temp: 98.3 F (36.8 C)  TempSrc: Oral  SpO2: 99%    General appearance: alert; no distress HEENT: [place]; AT; PERRLA; no restriction of the extraocular movements OD: without conjunctival injection; without drainage; without corneal  opacities; without limbal flush; without periorbital swelling or erythema; lower lid with edema/swelling; painful to touch Neck: supple without LAD Skin: warm and dry Psychological: alert and cooperative; normal mood and affect   Allergies  Allergen Reactions   Tetracyclines & Related    Tramadol Nausea And Vomiting    Past Medical History:  Diagnosis Date   Anxiety    Depression    Social History   Socioeconomic History   Marital status: Divorced    Spouse name: Not on file   Number of children: Not on file   Years of education: Not on file   Highest education level: Not on file  Occupational History   Not on file  Tobacco Use   Smoking status: Former    Pack years: 0.00   Smokeless tobacco: Never  Vaping Use   Vaping Use: Never used  Substance and Sexual Activity   Alcohol use: Yes    Alcohol/week: 0.0 standard drinks    Comment: social   Drug use: No   Sexual activity: Yes    Partners: Male    Birth control/protection: I.U.D.  Other Topics Concern   Not on file  Social History Narrative   Not on file   Social Determinants of Health   Financial Resource Strain: Not on file  Food Insecurity: Not on file  Transportation Needs: Not on file  Physical Activity: Not on file  Stress: Not on file  Social Connections: Not on file  Intimate Partner Violence: Not on file  Family History  Problem Relation Age of Onset   Diabetes Mother    Kidney cancer Mother    Congestive Heart Failure Mother    Past Surgical History:  Procedure Laterality Date   INTRAUTERINE DEVICE INSERTION     WISDOM TOOTH EXTRACTION        Vanessa Kick, MD 03/01/21 1234

## 2021-04-10 ENCOUNTER — Other Ambulatory Visit: Payer: Self-pay

## 2021-04-10 ENCOUNTER — Ambulatory Visit
Admission: EM | Admit: 2021-04-10 | Discharge: 2021-04-10 | Disposition: A | Discharge status: Home / Self Care | Payer: 59 | Attending: Emergency Medicine | Admitting: Emergency Medicine

## 2021-04-10 ENCOUNTER — Encounter: Payer: Self-pay | Admitting: Emergency Medicine

## 2021-04-10 DIAGNOSIS — H00012 Hordeolum externum right lower eyelid: Secondary | ICD-10-CM

## 2021-04-10 MED ORDER — HYDROCODONE-ACETAMINOPHEN 5-325 MG PO TABS: 1.0000 | ORAL_TABLET | Freq: Four times a day (QID) | ORAL | 0 refills | Status: AC | PRN

## 2021-04-10 MED ORDER — ERYTHROMYCIN 5 MG/GM OP OINT: TOPICAL_OINTMENT | OPHTHALMIC | 0 refills | Status: AC

## 2021-04-10 MED ORDER — DOXYCYCLINE HYCLATE 100 MG PO CAPS: 100.0000 mg | ORAL_CAPSULE | Freq: Two times a day (BID) | ORAL | 0 refills | Status: AC

## 2021-04-10 MED ORDER — CEPHALEXIN 500 MG PO CAPS: 500.0000 mg | ORAL_CAPSULE | Freq: Four times a day (QID) | ORAL | 0 refills | Status: AC

## 2021-04-10 NOTE — Discharge Instructions (Addendum)
Stye  -Warm compresses multiple times a day with massage afterward  - Do not wear contacts or make up  - Keep lid clean, may use baby shampoo diluted, gentle lid scrub - Keflex  -Erythromycin ointment - Return if increasing, redness, pain or swelling, decreased vision.

## 2021-04-10 NOTE — ED Triage Notes (Signed)
Pt presents to [facility] for evaluation of returned infection to right eye with pain and swelling.  Denies vision changes.

## 2021-04-10 NOTE — ED Provider Notes (Signed)
[facility]    CSN: [id number] Arrival date & time: 04/10/21  0801      History   Chief Complaint Chief Complaint  Patient presents with   Eye Pain    HPI Cathy Schroeder is a 50 y.o. female presenting today for evaluation of right eye eye infection.  Reports increased pain redness and swelling to her right lower lid over the past few days.  Reports history of similar approximately 1 month ago and was treated with tobramycin drops and prednisone course.  Feels irritating sensation with blinking.  Denies contact use.  Denies vision changes.  HPI  Past Medical History:  Diagnosis Date   Anxiety    Depression     Patient Active Problem List   Diagnosis Date Noted   Family history of early CAD- mom mid-40's 01/19/2017   Family history of diabetes mellitus in first degree relative- MOM- mid-40 01/19/2017   Adjustment disorder with mixed anxiety and depressed mood 01/19/2017   Attention or concentration deficit 01/19/2017   Cherry hemangioma 01/19/2017   Vitamin D insufficiency 01/19/2017   Sleeping difficulties-due to stress 01/19/2017   Anxiety 05/21/2016   Elevated BP without diagnosis of hypertension 05/21/2016   Left cervical radiculopathy 05/21/2016   Overweight (BMI 25.0-29.9) 05/21/2016   Tinea corporis 05/21/2016   Increased frequency of urination 05/21/2016    Past Surgical History:  Procedure Laterality Date   INTRAUTERINE DEVICE INSERTION     WISDOM TOOTH EXTRACTION      OB History     Gravida  3   Para  2   Term  2   Preterm      AB  1   Living  2      SAB      IAB      Ectopic      Multiple      Live Births  2            Home Medications    Prior to Admission medications   Medication Sig Start Date End Date Taking? Authorizing Provider  cephALEXin (KEFLEX) 500 MG capsule Take 1 capsule (500 mg total) by mouth 4 (four) times daily for 7 days. 04/10/21 04/17/21 Yes Zitlaly Malson C, PA-C  doxycycline (VIBRAMYCIN)  100 MG capsule Take 1 capsule (100 mg total) by mouth 2 (two) times daily for 7 days. 04/10/21 04/17/21 Yes Curlie Macken C, PA-C  erythromycin ophthalmic ointment Place a 1/2 inch ribbon of ointment into the lower eyelid 4 times daily 04/10/21  Yes Cledith Abdou C, PA-C  HYDROcodone-acetaminophen (NORCO/VICODIN) 5-325 MG tablet Take 1-2 tablets by mouth every 6 (six) hours as needed. 04/10/21  Yes Asar Evilsizer C, PA-C  levonorgestrel (MIRENA) 20 MCG/24HR IUD 1 each by Intrauterine route once.    [provider]    Family History Family History  Problem Relation Age of Onset   Diabetes Mother    Kidney cancer Mother    Congestive Heart Failure Mother     Social History Social History   Tobacco Use   Smoking status: Former   Smokeless tobacco: Never  Scientific laboratory technician Use: Never used  Substance Use Topics   Alcohol use: Yes    Alcohol/week: 0.0 standard drinks    Comment: social   Drug use: No     Allergies   Tetracyclines & related and Tramadol   Review of Systems Review of Systems  Constitutional:  Negative for activity change, appetite change, chills, fatigue and fever.  HENT:  Negative for congestion, ear pain, rhinorrhea, sinus pressure, sore throat and trouble swallowing.   Eyes:  Positive for pain and redness. Negative for photophobia, discharge, itching and visual disturbance.  Respiratory:  Negative for cough, chest tightness and shortness of breath.   Cardiovascular:  Negative for chest pain.  Gastrointestinal:  Negative for abdominal pain, diarrhea, nausea and vomiting.  Musculoskeletal:  Negative for myalgias.  Skin:  Negative for rash.  Neurological:  Negative for dizziness, light-headedness and headaches.    Physical Exam Triage Vital Signs ED Triage Vitals  Enc Vitals Group     BP      Pulse      Resp      Temp      Temp src      SpO2      Weight      Height      Head Circumference      Peak Flow      Pain Score      Pain Loc       Pain Edu?      Excl. in [place]?    No data found.  Updated Vital Signs BP (!) 150/107 (BP Location: Right Arm)   Pulse 97   Resp 18   SpO2 98%   Visual Acuity Right Eye Distance:   Left Eye Distance:   Bilateral Distance:    Right Eye Near:   Left Eye Near:    Bilateral Near:     Physical Exam Vitals and nursing note reviewed.  Constitutional:      Appearance: She is well-developed.     Comments: No acute distress  HENT:     Head: Normocephalic and atraumatic.     Nose: Nose normal.  Eyes:     Extraocular Movements: Extraocular movements intact.     Conjunctiva/sclera: Conjunctivae normal.     Pupils: Pupils are equal, round, and reactive to light.     Comments: Right lower lid with mild swelling and erythema noted to lash line, inner conjunctiva erythematous with white punctate area  Cardiovascular:     Rate and Rhythm: Normal rate.  Pulmonary:     Effort: Pulmonary effort is normal. No respiratory distress.  Abdominal:     General: There is no distension.  Musculoskeletal:        General: Normal range of motion.     Cervical back: Neck supple.  Skin:    General: Skin is warm and dry.  Neurological:     Mental Status: She is alert and oriented to person, place, and time.     UC Treatments / Results  Labs (all labs ordered are listed, but only abnormal results are displayed) Labs Reviewed - No data to display  EKG   Radiology No results found.  Procedures Procedures (including critical care time)  Medications Ordered in UC Medications - No data to display  Initial Impression / Assessment and Plan / UC Course  I have reviewed the triage vital signs and the nursing notes.  Pertinent labs & imaging results that were available during my care of the patient were reviewed by me and considered in my medical decision making (see chart for details).     Exam consistent with stye, no sign of preseptal/septal cellulitis at this time, recommending  treatment with topical erythromycin ointment, Keflex orally warm compresses gentle lid scrubs and close monitoring.  May switch to printed Doxy prescription if not seeing any improvement with Keflex over the next 2 to 3  days.  Discussed strict return precautions. Patient verbalized understanding and is agreeable with plan.  Final Clinical Impressions(s) / UC Diagnoses   Final diagnoses:  Hordeolum externum of right lower eyelid     Discharge Instructions      Stye  -Warm compresses multiple times a day with massage afterward  - Do not wear contacts or make up  - Keep lid clean, may use baby shampoo diluted, gentle lid scrub - Keflex  -Erythromycin ointment - Return if increasing, redness, pain or swelling, decreased vision.       ED Prescriptions     Medication Sig Dispense Auth. Provider   cephALEXin (KEFLEX) 500 MG capsule Take 1 capsule (500 mg total) by mouth 4 (four) times daily for 7 days. 28 capsule Ayauna Mcnay C, PA-C   erythromycin ophthalmic ointment Place a 1/2 inch ribbon of ointment into the lower eyelid 4 times daily 3.5 g Shaneya Taketa C, PA-C   HYDROcodone-acetaminophen (NORCO/VICODIN) 5-325 MG tablet Take 1-2 tablets by mouth every 6 (six) hours as needed. 8 tablet Deneka Greenwalt C, PA-C   doxycycline (VIBRAMYCIN) 100 MG capsule Take 1 capsule (100 mg total) by mouth 2 (two) times daily for 7 days. 14 capsule Anasia Agro, [place] C, PA-C      I have reviewed the PDMP during this encounter.   Janith Lima, [place] 04/10/21 [phone]

## 2021-10-19 ENCOUNTER — Emergency Department (HOSPITAL_BASED_OUTPATIENT_CLINIC_OR_DEPARTMENT_OTHER)
Admission: EM | Admit: 2021-10-19 | Discharge: 2021-10-19 | Disposition: A | Discharge status: Home / Self Care | Payer: 59 | Attending: Emergency Medicine | Admitting: Emergency Medicine

## 2021-10-19 ENCOUNTER — Encounter (HOSPITAL_BASED_OUTPATIENT_CLINIC_OR_DEPARTMENT_OTHER): Payer: Self-pay | Admitting: Emergency Medicine

## 2021-10-19 ENCOUNTER — Other Ambulatory Visit: Payer: Self-pay

## 2021-10-19 DIAGNOSIS — L03011 Cellulitis of right finger: Secondary | ICD-10-CM | POA: Diagnosis not present

## 2021-10-19 DIAGNOSIS — M7989 Other specified soft tissue disorders: Secondary | ICD-10-CM | POA: Diagnosis present

## 2021-10-19 MED ORDER — SULFAMETHOXAZOLE-TRIMETHOPRIM 800-160 MG PO TABS: 1.0000 | ORAL_TABLET | Freq: Two times a day (BID) | ORAL | 0 refills | Status: AC

## 2021-10-19 MED ORDER — CEPHALEXIN 500 MG PO CAPS: 500.0000 mg | ORAL_CAPSULE | Freq: Four times a day (QID) | ORAL | 0 refills | Status: AC

## 2021-10-19 MED ORDER — LIDOCAINE HCL (PF) 1 % IJ SOLN
5.0000 mL | Freq: Once | INTRAMUSCULAR | Status: DC
  Filled 2021-10-19: qty 5

## 2021-10-19 NOTE — ED Provider Notes (Signed)
[facility] EMERGENCY DEPARTMENT Provider Note   CSN: [id number] Arrival date & time: 10/19/21  0758     History  Chief Complaint  Patient presents with   Finger Swelling    Cathy Schroeder is a 51 y.o. female.  HPI Patient presents with right index finger swelling.  Has around for 3 days.  More red and more swollen.  No drainage.  States she has had an abscess in her armpit previously.  Works as a Marine scientist.  No fevers or chills.  Otherwise healthy.    Home Medications Prior to Admission medications   Medication Sig Start Date End Date Taking? Authorizing Provider  cephALEXin (KEFLEX) 500 MG capsule Take 1 capsule (500 mg total) by mouth 4 (four) times daily. 10/19/21  Yes Davonna Belling, MD  sulfamethoxazole-trimethoprim (BACTRIM DS) 800-160 MG tablet Take 1 tablet by mouth 2 (two) times daily for 7 days. 10/19/21 10/26/21 Yes Davonna Belling, MD  erythromycin ophthalmic ointment Place a 1/2 inch ribbon of ointment into the lower eyelid 4 times daily 04/10/21   Wieters, Hallie C, PA-C  HYDROcodone-acetaminophen (NORCO/VICODIN) 5-325 MG tablet Take 1-2 tablets by mouth every 6 (six) hours as needed. 04/10/21   Wieters, Hallie C, PA-C  levonorgestrel (MIRENA) 20 MCG/24HR IUD 1 each by Intrauterine route once.    [provider]      Allergies    Tetracyclines & related and Tramadol    Review of Systems   Review of Systems  Constitutional:  Negative for appetite change.  Musculoskeletal:        Right index finger pain and swelling.   Physical Exam Updated Vital Signs BP 136/88 (BP Location: Left Arm)    Pulse 92    Temp 98.7 F (37.1 C) (Oral)    Resp 18    Ht 5\' 5"  (1.651 m)    Wt 66.2 kg    SpO2 100%    BMI 24.30 kg/m  Physical Exam Vitals reviewed.  Musculoskeletal:     Comments: Distal phalanx of right index finger erythematous indurated with swelling.  Likely paronychia over the lateral aspect and proximal aspect of the nail.  Also some pain and  swelling over the volar aspect also.  Skin:    Capillary Refill: Capillary refill takes less than 2 seconds.  Neurological:     Mental Status: She is alert.    ED Results / Procedures / Treatments   Labs (all labs ordered are listed, but only abnormal results are displayed) Labs Reviewed - No data to display  EKG None  Radiology No results found.  Procedures .Marland KitchenIncision and Drainage  Date/Time: 10/19/2021 8:51 AM Performed by: Davonna Belling, MD Authorized by: Davonna Belling, MD   Consent:    Consent obtained:  Verbal   Risks, benefits, and alternatives were discussed: yes     Risks discussed:  Bleeding, incomplete drainage, pain, damage to other organs and infection   Alternatives discussed:  Delayed treatment, alternative treatment, no treatment, observation and referral Location:    Type:  Abscess (Paronychia)   Location:  Upper extremity   Upper extremity location:  Finger   Finger location:  R index finger Pre-procedure details:    Skin preparation:  Chlorhexidine Sedation:    Sedation type:  None Anesthesia:    Anesthesia method: Digital block. Procedure type:    Complexity:  Simple Procedure details:    Ultrasound guidance: no     Needle aspiration: no     Incision types:  Single straight  Incision depth:  Subcutaneous   Drainage:  Bloody and purulent   Drainage amount:  Moderate   Wound treatment:  Wound left open   Packing materials:  None Post-procedure details:    Procedure completion:  Tolerated well, no immediate complications    Medications Ordered in ED Medications  lidocaine (PF) (XYLOCAINE) 1 % injection 5 mL (has no administration in time range)    ED Course/ Medical Decision Making/ A&P                           Medical Decision Making Problems Addressed: Paronychia of finger, right: acute illness or injury  Risk Prescription drug management.   Patient with finger swelling.  Appears to be infectious.  Likely paronychia.   Less likely felon.  Paronychia was drained.  Feeling somewhat better.  With continued induration will give antibiotics.  Appears stable for discharge home.        Final Clinical Impression(s) / ED Diagnoses Final diagnoses:  Paronychia of finger, right    Rx / DC Orders ED Discharge Orders          Ordered    cephALEXin (KEFLEX) 500 MG capsule  4 times daily        10/19/21 0839    sulfamethoxazole-trimethoprim (BACTRIM DS) 800-160 MG tablet  2 times daily        10/19/21 0839              Davonna Belling, MD 10/19/21 [phone]

## 2021-10-19 NOTE — Discharge Instructions (Addendum)
Soaks may help with the drainage.  Watch for worsening infection.  Follow-up with primary care doctor if symptoms do not improve.

## 2021-10-19 NOTE — ED Triage Notes (Signed)
Pt arrives pov with c/o right index finger swelling x 3 days. Tylenol -1g, at 0300, ibuprofen 600mg  0500. Denies fever

## 2024-01-02 DIAGNOSIS — Z87891 Personal history of nicotine dependence: Secondary | ICD-10-CM | POA: Diagnosis not present

## 2024-01-02 DIAGNOSIS — X500XXA Overexertion from strenuous movement or load, initial encounter: Secondary | ICD-10-CM | POA: Diagnosis not present

## 2024-01-02 DIAGNOSIS — S838X2A Sprain of other specified parts of left knee, initial encounter: Secondary | ICD-10-CM | POA: Diagnosis not present

## 2024-01-02 DIAGNOSIS — Z975 Presence of (intrauterine) contraceptive device: Secondary | ICD-10-CM | POA: Diagnosis not present

## 2024-01-02 DIAGNOSIS — M25562 Pain in left knee: Secondary | ICD-10-CM | POA: Diagnosis not present

## 2024-01-06 DIAGNOSIS — M1712 Unilateral primary osteoarthritis, left knee: Secondary | ICD-10-CM | POA: Diagnosis not present

## 2024-01-06 DIAGNOSIS — M2392 Unspecified internal derangement of left knee: Secondary | ICD-10-CM | POA: Diagnosis not present
